# Patient Record
Sex: Male | Born: 1990
Health system: Southern US, Community
[De-identification: ages and names within clinical notes are randomized; demographics above are authoritative.]

## PROBLEM LIST (undated history)

## (undated) DIAGNOSIS — K219 Gastro-esophageal reflux disease without esophagitis: Secondary | ICD-10-CM

## (undated) DIAGNOSIS — R519 Headache, unspecified: Secondary | ICD-10-CM

## (undated) DIAGNOSIS — R51 Headache: Secondary | ICD-10-CM

## (undated) DIAGNOSIS — J45909 Unspecified asthma, uncomplicated: Secondary | ICD-10-CM

## (undated) HISTORY — PX: APPENDECTOMY: SHX54

---

## 2017-12-15 ENCOUNTER — Other Ambulatory Visit: Payer: Self-pay

## 2017-12-15 ENCOUNTER — Encounter: Payer: Self-pay | Admitting: *Deleted

## 2017-12-19 NOTE — Discharge Instructions (Signed)
[facility] °[facility] °ENDOSCOPIC SINUS SURGERY °[facility] ° °What is Functional Endoscopic Sinus Surgery? ° The Surgery involves making the natural openings of the sinuses larger by removing the bony partitions that separate the sinuses from the nasal cavity.  The natural sinus lining is preserved as much as possible to allow the sinuses to resume normal function after the surgery.  In some patients nasal polyps (excessively swollen lining of the sinuses) may be removed to relieve obstruction of the sinus openings.  The surgery is performed through the nose using lighted scopes, which eliminates the need for incisions on the face.  A septoplasty is a different procedure which is sometimes performed with sinus surgery.  It involves straightening the boy partition that separates the two sides of your nose.  A crooked or deviated septum may need repair if is obstructing the sinuses or nasal airflow.  Turbinate reduction is also often performed during sinus surgery.  The turbinates are bony proturberances from the side walls of the nose which swell and can obstruct the nose in patients with sinus and allergy problems.  Their size can be surgically reduced to help relieve nasal obstruction. ° °What Can Sinus Surgery Do For Me? ° Sinus surgery can reduce the frequency of sinus infections requiring antibiotic treatment.  This can provide improvement in nasal congestion, post-nasal drainage, facial pressure and nasal obstruction.  Surgery will NOT prevent you from ever having an infection again, so it usually only for patients who get infections 4 or more times yearly requiring antibiotics, or for infections that do not clear with antibiotics.  It will not cure nasal allergies, so patients with allergies may still require medication to treat their allergies after surgery. Surgery may improve headaches related to sinusitis, however, some people will continue to  require medication to control sinus headaches related to allergies.  Surgery will do nothing for other forms of headache (migraine, tension or cluster). ° °What Are the Risks of Endoscopic Sinus Surgery? ° Current techniques allow surgery to be performed safely with little risk, however, there are rare complications that patients should be aware of.  Because the sinuses are located around the eyes, there is risk of eye injury, including blindness, though again, this would be quite rare. This is usually a result of bleeding behind the eye during surgery, which puts the vision oat risk, though there are treatments to protect the vision and prevent permanent disrupted by surgery causing a leak of the spinal fluid that surrounds the brain.  More serious complications would include bleeding inside the brain cavity or damage to the brain.  Again, all of these complications are uncommon, and spinal fluid leaks can be safely managed surgically if they occur.  The most common complication of sinus surgery is bleeding from the nose, which may require packing or cauterization of the nose.  Continued sinus have polyps may experience recurrence of the polyps requiring revision surgery.  Alterations of sense of smell or injury to the tear ducts are also rare complications.  ° °What is the Surgery Like, and what is the Recovery? ° The Surgery usually takes a couple of hours to perform, and is usually performed under a general anesthetic (completely asleep).  Patients are usually discharged home after a couple of hours.  Sometimes during surgery it is necessary to pack the nose to control bleeding, and the packing is left in place for 24 - 48 hours, and removed by your surgeon.    If a septoplasty was performed during the procedure, there is often a splint placed which must be removed after 5-7 days.   °Discomfort: Pain is usually mild to moderate, and can be controlled by prescription pain medication or acetaminophen (Tylenol).   Aspirin, Ibuprofen (Advil, Motrin), or Naprosyn (Aleve) should be avoided, as they can cause increased bleeding.  Most patients feel sinus pressure like they have a bad head cold for several days.  Sleeping with your head elevated can help reduce swelling and facial pressure, as can ice packs over the face.  A humidifier may be helpful to keep the mucous and blood from drying in the nose.  ° °Diet: There are no specific diet restrictions, however, you should generally start with clear liquids and a light diet of bland foods because the anesthetic can cause some nausea.  Advance your diet depending on how your stomach feels.  Taking your pain medication with food will often help reduce stomach upset which pain medications can cause. ° °Nasal Saline Irrigation: It is important to remove blood clots and dried mucous from the nose as it is healing.  This is done by having you irrigate the nose at least 3 - 4 times daily with a salt water solution.  We recommend using NeilMed Sinus Rinse (available at the drug store).  Fill the squeeze bottle with the solution, bend over a sink, and insert the tip of the squeeze bottle into the nose ½ of an inch.  Point the tip of the squeeze bottle towards the inside corner of the eye on the same side your irrigating.  Squeeze the bottle and gently irrigate the nose.  If you bend forward as you do this, most of the fluid will flow back out of the nose, instead of down your throat.   The solution should be warm, near body temperature, when you irrigate.   Each time you irrigate, you should use a full squeeze bottle.  ° °Note that if you are instructed to use Nasal Steroid Sprays at any time after your surgery, irrigate with saline BEFORE using the steroid spray, so you do not wash it all out of the nose. °Another product, Nasal Saline Gel (such as AYR Nasal Saline Gel) can be applied in each nostril 3 - 4 times daily to moisture the nose and reduce scabbing or crusting. ° °Bleeding:   Bloody drainage from the nose can be expected for several days, and patients are instructed to irrigate their nose frequently with salt water to help remove mucous and blood clots.  The drainage may be dark red or brown, though some fresh blood may be seen intermittently, especially after irrigation.  Do not blow you nose, as bleeding may occur. If you must sneeze, keep your mouth open to allow air to escape through your mouth. ° °If heavy bleeding occurs: Irrigate the nose with saline to rinse out clots, then spray the nose 3 - 4 times with Afrin Nasal Decongestant Spray.  The spray will constrict the blood vessels to slow bleeding.  Pinch the lower half of your nose shut to apply pressure, and lay down with your head elevated.  Ice packs over the nose may help as well. If bleeding persists despite these measures, you should notify your doctor.  Do not use the Afrin routinely to control nasal congestion after surgery, as it can result in worsening congestion and may affect healing.  ° ° ° °Activity: Return to work varies among patients. Most patients will be   out of work at least 5 - 7 days to recover.  Patient may return to work after they are off of narcotic pain medication, and feeling well enough to perform the functions of their job.  Patients must avoid heavy lifting (over 10 pounds) or strenuous physical for 2 weeks after surgery, so your employer may need to assign you to light duty, or keep you out of work longer if light duty is not possible.  NOTE: you should not drive, operate dangerous machinery, do any mentally demanding tasks or make any important legal or financial decisions while on narcotic pain medication and recovering from the general anesthetic.  °  °Call Your Doctor Immediately if You Have Any of the Following: °1. Bleeding that you cannot control with the above measures °2. Loss of vision, double vision, bulging of the eye or black eyes. °3. Fever over 101 degrees °4. Neck stiffness with  severe headache, fever, nausea and change in mental state. °You are always encourage to call anytime with concerns, however, please call with requests for pain medication refills during office hours. ° °Office Endoscopy: During follow-up visits your doctor will remove any packing or splints that may have been placed and evaluate and clean your sinuses endoscopically.  Topical anesthetic will be used to make this as comfortable as possible, though you may want to take your pain medication prior to the visit.  How often this will need to be done varies from patient to patient.  After complete recovery from the surgery, you may need follow-up endoscopy from time to time, particularly if there is concern of recurrent infection or nasal polyps. ° ° °General Anesthesia, Adult, Care After °These instructions provide you with information about caring for yourself after your procedure. Your health care provider may also give you more specific instructions. Your treatment has been planned according to current medical practices, but problems sometimes occur. Call your health care provider if you have any problems or questions after your procedure. °What can I expect after the procedure? °After the procedure, it is common to have: °· Vomiting. °· A sore throat. °· Mental slowness. ° °It is common to feel: °· Nauseous. °· Cold or shivery. °· Sleepy. °· Tired. °· Sore or achy, even in parts of your body where you did not have surgery. ° °Follow these instructions at home: °For at least 24 hours after the procedure: °· Do not: °? Participate in activities where you could fall or become injured. °? Drive. °? Use heavy machinery. °? Drink alcohol. °? Take sleeping pills or medicines that cause drowsiness. °? Make important decisions or sign legal documents. °? Take care of children on your own. °· Rest. °Eating and drinking °· If you vomit, drink water, juice, or soup when you can drink without vomiting. °· Drink enough fluid to  keep your urine clear or pale yellow. °· Make sure you have little or no nausea before eating solid foods. °· Follow the diet recommended by your health care provider. °General instructions °· Have a responsible adult stay with you until you are awake and alert. °· Return to your normal activities as told by your health care provider. Ask your health care provider what activities are safe for you. °· Take over-the-counter and prescription medicines only as told by your health care provider. °· If you smoke, do not smoke without supervision. °· Keep all follow-up visits as told by your health care provider. This is important. °Contact a health care provider if: °· You   continue to have nausea or vomiting at home, and medicines are not helpful. °· You cannot drink fluids or start eating again. °· You cannot urinate after 8-12 hours. °· You develop a skin rash. °· You have fever. °· You have increasing redness at the site of your procedure. °Get help right away if: °· You have difficulty breathing. °· You have chest pain. °· You have unexpected bleeding. °· You feel that you are having a life-threatening or urgent problem. °This information is not intended to replace advice given to you by your health care provider. Make sure you discuss any questions you have with your health care provider. °Document Released: 06/14/2000 Document Revised: 08/11/2015 Document Reviewed: 02/20/2015 °Elsevier Interactive Patient Education © 2018 [employer] ° °

## 2017-12-22 ENCOUNTER — Ambulatory Visit
Admission: RE | Admit: 2017-12-22 | Discharge: 2017-12-22 | Disposition: A | Discharge status: Home / Self Care | Payer: Managed Care, Other (non HMO) | Source: Ambulatory Visit | Attending: Otolaryngology | Admitting: Otolaryngology

## 2017-12-22 ENCOUNTER — Ambulatory Visit: Payer: Managed Care, Other (non HMO) | Admitting: Anesthesiology

## 2017-12-22 ENCOUNTER — Encounter: Payer: Self-pay | Admitting: Otolaryngology

## 2017-12-22 ENCOUNTER — Encounter
Admission: RE | Disposition: A | Discharge status: Home / Self Care | Payer: Self-pay | Source: Ambulatory Visit | Attending: Otolaryngology

## 2017-12-22 DIAGNOSIS — J343 Hypertrophy of nasal turbinates: Secondary | ICD-10-CM | POA: Diagnosis not present

## 2017-12-22 DIAGNOSIS — J342 Deviated nasal septum: Secondary | ICD-10-CM | POA: Insufficient documentation

## 2017-12-22 DIAGNOSIS — Z87891 Personal history of nicotine dependence: Secondary | ICD-10-CM | POA: Diagnosis not present

## 2017-12-22 DIAGNOSIS — J45909 Unspecified asthma, uncomplicated: Secondary | ICD-10-CM | POA: Diagnosis not present

## 2017-12-22 HISTORY — DX: Gastro-esophageal reflux disease without esophagitis: K21.9

## 2017-12-22 HISTORY — DX: Unspecified asthma, uncomplicated: J45.909

## 2017-12-22 HISTORY — PX: SEPTOPLASTY: SHX2393

## 2017-12-22 HISTORY — DX: Headache: R51

## 2017-12-22 HISTORY — DX: Headache, unspecified: R51.9

## 2017-12-22 HISTORY — PX: TURBINATE REDUCTION: SHX6157

## 2017-12-22 SURGERY — SEPTOPLASTY, NOSE
Anesthesia: General | Site: Nose

## 2017-12-22 MED ORDER — LACTATED RINGERS IV SOLN
INTRAVENOUS | Status: DC
  Administered 2017-12-22 (×2): via INTRAVENOUS

## 2017-12-22 MED ORDER — CEFAZOLIN SODIUM-DEXTROSE 2-4 GM/100ML-% IV SOLN
2.0000 g | Freq: Once | INTRAVENOUS | Status: AC
  Administered 2017-12-22: 2 g via INTRAVENOUS

## 2017-12-22 MED ORDER — OXYMETAZOLINE HCL 0.05 % NA SOLN
2.0000 | Freq: Once | NASAL | Status: AC
  Administered 2017-12-22: 2 via NASAL

## 2017-12-22 MED ORDER — ACETAMINOPHEN 10 MG/ML IV SOLN
1000.0000 mg | Freq: Once | INTRAVENOUS | Status: AC
  Administered 2017-12-22: 1000 mg via INTRAVENOUS

## 2017-12-22 MED ORDER — LIDOCAINE HCL (CARDIAC) PF 100 MG/5ML IV SOSY
PREFILLED_SYRINGE | INTRAVENOUS | Status: DC | PRN
  Administered 2017-12-22: 40 mg via INTRAVENOUS

## 2017-12-22 MED ORDER — SUCCINYLCHOLINE CHLORIDE 20 MG/ML IJ SOLN
INTRAMUSCULAR | Status: DC | PRN
  Administered 2017-12-22: 80 mg via INTRAVENOUS

## 2017-12-22 MED ORDER — OXYCODONE HCL 5 MG/5ML PO SOLN
5.0000 mg | Freq: Once | ORAL | Status: AC | PRN
  Administered 2017-12-22: 5 mg via ORAL

## 2017-12-22 MED ORDER — GLYCOPYRROLATE 0.2 MG/ML IJ SOLN
INTRAMUSCULAR | Status: DC | PRN
  Administered 2017-12-22: 0.1 mg via INTRAVENOUS

## 2017-12-22 MED ORDER — MIDAZOLAM HCL 5 MG/5ML IJ SOLN
INTRAMUSCULAR | Status: DC | PRN
  Administered 2017-12-22: 2 mg via INTRAVENOUS

## 2017-12-22 MED ORDER — ONDANSETRON HCL 4 MG/2ML IJ SOLN
INTRAMUSCULAR | Status: DC | PRN
  Administered 2017-12-22: 4 mg via INTRAVENOUS

## 2017-12-22 MED ORDER — LIDOCAINE-EPINEPHRINE 1 %-1:100000 IJ SOLN
INTRAMUSCULAR | Status: DC | PRN
  Administered 2017-12-22: 4 mL

## 2017-12-22 MED ORDER — DEXAMETHASONE SODIUM PHOSPHATE 4 MG/ML IJ SOLN
INTRAMUSCULAR | Status: DC | PRN
  Administered 2017-12-22: 10 mg via INTRAVENOUS

## 2017-12-22 MED ORDER — FENTANYL CITRATE (PF) 100 MCG/2ML IJ SOLN: 25.0000 ug | INTRAMUSCULAR | Status: DC | PRN

## 2017-12-22 MED ORDER — ONDANSETRON HCL 4 MG/2ML IJ SOLN: 4.0000 mg | Freq: Once | INTRAMUSCULAR | Status: DC | PRN

## 2017-12-22 MED ORDER — OXYCODONE HCL 5 MG PO TABS: 5.0000 mg | ORAL_TABLET | Freq: Once | ORAL | Status: AC | PRN

## 2017-12-22 MED ORDER — LIDOCAINE HCL 4 % MT SOLN
OROMUCOSAL | Status: DC | PRN
  Administered 2017-12-22: 4 mL via TOPICAL

## 2017-12-22 MED ORDER — FENTANYL CITRATE (PF) 100 MCG/2ML IJ SOLN
INTRAMUSCULAR | Status: DC | PRN
  Administered 2017-12-22: 25 ug via INTRAVENOUS
  Administered 2017-12-22: 100 ug via INTRAVENOUS
  Administered 2017-12-22: 25 ug via INTRAVENOUS

## 2017-12-22 MED ORDER — PHENYLEPHRINE HCL 0.5 % NA SOLN
NASAL | Status: DC | PRN
  Administered 2017-12-22: 12:00:00 via TOPICAL

## 2017-12-22 MED ORDER — PROPOFOL 10 MG/ML IV BOLUS
INTRAVENOUS | Status: DC | PRN
  Administered 2017-12-22: 150 mg via INTRAVENOUS

## 2017-12-22 SURGICAL SUPPLY — 28 items
CANISTER SUCT 1200ML W/VALVE (MISCELLANEOUS) ×4 IMPLANT
COAGULATOR SUCT 8FR VV (MISCELLANEOUS) ×4 IMPLANT
DRAPE HEAD BAR (DRAPES) ×4 IMPLANT
ELECT REM PT RETURN 9FT ADLT (ELECTROSURGICAL) ×4
ELECTRODE REM PT RTRN 9FT ADLT (ELECTROSURGICAL) ×2 IMPLANT
GLOVE PI ULTRA LF STRL 7.5 (GLOVE) ×4 IMPLANT
GLOVE PI ULTRA NON LATEX 7.5 (GLOVE) ×4
KIT TURNOVER KIT A (KITS) ×4 IMPLANT
NDL ANESTHESIA 27G X 3.5 (NEEDLE) ×2 IMPLANT
NDL HYPO 27GX1-1/4 (NEEDLE) ×2 IMPLANT
NEEDLE ANESTHESIA  27G X 3.5 (NEEDLE) ×2
NEEDLE ANESTHESIA 27G X 3.5 (NEEDLE) ×2 IMPLANT
NEEDLE HYPO 27GX1-1/4 (NEEDLE) ×4 IMPLANT
NS IRRIG 500ML POUR BTL (IV SOLUTION) ×4 IMPLANT
PACK ENT CUSTOM (PACKS) ×4 IMPLANT
PATTIES SURGICAL .5 X3 (DISPOSABLE) ×4 IMPLANT
SOL ANTI-FOG 6CC FOG-OUT (MISCELLANEOUS) ×2 IMPLANT
SOL FOG-OUT ANTI-FOG 6CC (MISCELLANEOUS) ×2
SPLINT NASAL SEPTAL BLV .50 ST (MISCELLANEOUS) ×4 IMPLANT
STRAP BODY AND KNEE 60X3 (MISCELLANEOUS) ×4 IMPLANT
SUT CHROMIC 3-0 (SUTURE) ×2
SUT CHROMIC 3-0 KS 27XMFL CR (SUTURE) ×2
SUT ETHILON 3-0 KS 30 BLK (SUTURE) ×4 IMPLANT
SUT PLAIN GUT 4-0 (SUTURE) ×4 IMPLANT
SUTURE CHRMC 3-0 KS 27XMFL CR (SUTURE) IMPLANT
SYR 3ML LL SCALE MARK (SYRINGE) ×4 IMPLANT
TOWEL OR 17X26 4PK STRL BLUE (TOWEL DISPOSABLE) ×4 IMPLANT
WATER STERILE IRR 250ML POUR (IV SOLUTION) ×4 IMPLANT

## 2017-12-22 NOTE — Transfer of Care (Signed)
Immediate Anesthesia Transfer of Care Note  Patient: Jared English  Procedure(s) Performed: SEPTOPLASTY (N/A Nose) BILATERAL INFERIOR TURBINATE REDUCTION (Bilateral Nose)  Patient Location: PACU  Anesthesia Type: General ETT  Level of Consciousness: awake, alert  and patient cooperative  Airway and Oxygen Therapy: Patient Spontanous Breathing and Patient connected to supplemental oxygen  Post-op Assessment: Post-op Vital signs reviewed, Patient's Cardiovascular Status Stable, Respiratory Function Stable, Patent Airway and No signs of Nausea or vomiting  Post-op Vital Signs: Reviewed and stable  Complications: No apparent anesthesia complications

## 2017-12-22 NOTE — Anesthesia Procedure Notes (Signed)
Procedure Name: Intubation Date/Time: 12/22/2017 12:16 PM Performed by: Jimmy Picket, CRNA Pre-anesthesia Checklist: Patient identified, Emergency Drugs available, Suction available, Patient being monitored and Timeout performed Patient Re-evaluated:Patient Re-evaluated prior to induction Oxygen Delivery Method: Circle system utilized Preoxygenation: Pre-oxygenation with 100% oxygen Induction Type: IV induction Ventilation: Mask ventilation without difficulty Laryngoscope Size: Miller and 3 Grade View: Grade I Tube type: Oral Rae Tube size: 7.5 mm Number of attempts: 1 Placement Confirmation: ETT inserted through vocal cords under direct vision,  positive ETCO2 and breath sounds checked- equal and bilateral Tube secured with: Tape Dental Injury: Teeth and Oropharynx as per pre-operative assessment

## 2017-12-22 NOTE — H&P (Signed)
H&P has been reviewedand patient reevaluated,  and no changes necessary. To be downloaded later.  

## 2017-12-22 NOTE — Anesthesia Preprocedure Evaluation (Addendum)
Anesthesia Evaluation  Patient identified by MRN, date of birth, ID band Patient awake    Reviewed: Allergy & Precautions, H&P , NPO status , Patient's Chart, lab work & pertinent test results, reviewed documented beta blocker date and time   Airway Mallampati: II  TM Distance: >3 FB Neck ROM: full    Dental no notable dental hx.    Pulmonary asthma , former smoker,    Pulmonary exam normal breath sounds clear to auscultation       Cardiovascular negative cardio ROS Normal cardiovascular exam     Neuro/Psych    GI/Hepatic Neg liver ROS, Medicated,  Endo/Other  negative endocrine ROS  Renal/GU negative Renal ROS  negative genitourinary   Musculoskeletal   Abdominal   Peds  Hematology negative hematology ROS (+)   Anesthesia Other Findings   Reproductive/Obstetrics negative OB ROS                            Anesthesia Physical Anesthesia Plan  ASA: II  Anesthesia Plan: General ETT   Post-op Pain Management:    Induction:   PONV Risk Score and Plan:   Airway Management Planned:   Additional Equipment:   Intra-op Plan:   Post-operative Plan:   Informed Consent: I have reviewed the patients History and Physical, chart, labs and discussed the procedure including the risks, benefits and alternatives for the proposed anesthesia with the patient or authorized representative who has indicated his/her understanding and acceptance.     Plan Discussed with:   Anesthesia Plan Comments:         Anesthesia Quick Evaluation

## 2017-12-22 NOTE — Anesthesia Postprocedure Evaluation (Signed)
Anesthesia Post Note  Patient: Jared English  Procedure(s) Performed: SEPTOPLASTY (N/A Nose) BILATERAL INFERIOR TURBINATE REDUCTION (Bilateral Nose)  Patient location during evaluation: PACU Anesthesia Type: General Level of consciousness: awake and alert Pain management: pain level controlled Vital Signs Assessment: post-procedure vital signs reviewed and stable Respiratory status: spontaneous breathing Cardiovascular status: blood pressure returned to baseline Anesthetic complications: no    Verner Chol, III,  Jenesys Casseus D

## 2017-12-22 NOTE — Op Note (Signed)
12/22/2017  1:30 PM [id number]   Pre-Op Dx:  Deviated Nasal Septum, Hypertrophic Inferior Turbinates  Post-op Dx: Same  Proc: Nasal Septoplasty, Bilateral Partial Reduction Inferior Turbinates   Surg:  Beverly Sessions Elmar Antigua  Anes:  GOT  EBL: 50 mL  Comp: None  Findings: Extremely deviated septum with the caudal tip extending into the left nostril opening.  Ethmoid plate buckled to the left side and pulling the quadrangular cartilage to the left.  Inferior overhanging cartilage and septal spur on the right side. Enlarged turbinates.   Procedure: With the patient in a comfortable supine position,  general orotracheal anesthesia was induced without difficulty.     The patient received preoperative Afrin spray for topical decongestion and vasoconstriction.  Intravenous prophylactic antibiotics were administered.  At an appropriate level, the patient was placed in a semi-sitting position.  Nasal vibrissae were trimmed.   1% Xylocaine with 1:100,000 epinephrine, 5 cc's, was infiltrated into the anterior floor of the nose, into the nasal spine region, into the membranous columella, and finally into the submucoperichondrial plane of the septum on both sides.  Several minutes were allowed for this to take effect.  Cottoniod pledgetts soaked in Afrin and 4% Xylocaine were placed into both nasal cavities and left while the patient was prepped and draped in the standard fashion.  The materials were removed from the nose and observed to be intact and correct in number.  The nose was inspected with a headlight and the 0 degrees scope with the findings as described above.  A left hemitransfixion incision was sharply executed and carried down to the quadrangular cartilage. The mucoperichondrium was elelvated along both sides of the quadrangular plate back to the bony-cartilaginous junction. The mucoperiostium was then elevated along the ethmoid plate and the vomer. The boney-catilaginous junction was then  split with a freer elevator and the mucoperiosteum was elevated on the opposite side. The mucoperiosteum was then elevated along the maxillary crest as needed to expose the crooked bone of the crest.  Boney spurs of the vomer and maxillary crest were removed with Lenoria Chime forceps.  The cartilaginous plate was trimmed along its posterior and inferior borders of about 2 mm of cartilage to free it up inferiorly. Some of the deviated ethmoid plate was then fractured and removed with Takahashi forceps to free up the posterior border of the quadrangular plate and allow it to swing back to the midline. The mucosal flaps were placed back into their anatomic position to allow visualization of the airways. The septum now sat in the midline with an improved airway.  A 3-0 Chromic suture on a Keith needle in used to anchor the inferior septum at the nasal spine with a through and through suture. The mucosal flaps are then sutured together using a through and through whip stitch of 4-0 Plain Gut with a mini-Keith needle.  This was used to close the hemitransfixion incision as well.   The inferior turbinates were then inspected. An incision was created along the inferior aspect of the left inferior turbinate with removal of some of the inferior soft tissue and bone. Electrocautery was used to control bleeding in the area. The remaining turbinate was then outfractured to open up the airway further. There was no significant bleeding noted. The right turbinate was then trimmed and outfractured in a similar fashion.  The airways were then visualized and showed open passageways on both sides that were significantly improved compared to before surgery. There was no signifcant bleeding. Nasal splints were  applied to both sides of the septum using Xomed 0.69mm regular sized splints that were trimmed, and then held in position with a 3-0 Nylon through and through suture.  The patient was turned back over to anesthesia, and  awakened, extubated, and taken to the PACU in satisfactory condition.  Dispo:   PACU to home  Plan: Ice, elevation, narcotic analgesia, steroid taper, and prophylactic antibiotics for the duration of indwelling nasal foreign bodies.  We will reevaluate the patient in the office in 6 days and remove the septal splints.  Return to work in 10 days, strenuous activities in two weeks.   Beverly Sessions Analiz Tvedt 12/22/2017 1:30 PM

## 2017-12-23 ENCOUNTER — Encounter: Payer: Self-pay | Admitting: Otolaryngology

## 2018-05-04 ENCOUNTER — Other Ambulatory Visit: Payer: Self-pay

## 2018-05-04 ENCOUNTER — Ambulatory Visit
Admission: EM | Admit: 2018-05-04 | Discharge: 2018-05-04 | Disposition: A | Discharge status: Home / Self Care | Payer: 59 | Attending: Family Medicine | Admitting: Family Medicine

## 2018-05-04 ENCOUNTER — Encounter: Payer: Self-pay | Admitting: Emergency Medicine

## 2018-05-04 DIAGNOSIS — J45990 Exercise induced bronchospasm: Secondary | ICD-10-CM | POA: Diagnosis not present

## 2018-05-04 MED ORDER — ALBUTEROL SULFATE HFA 108 (90 BASE) MCG/ACT IN AERS: 1.0000 | INHALATION_SPRAY | Freq: Four times a day (QID) | RESPIRATORY_TRACT | 0 refills | Status: DC | PRN

## 2018-05-04 NOTE — ED Provider Notes (Signed)
[facility]    CSN: [id number] Arrival date & time: 05/04/18  1835     History   Chief Complaint Chief Complaint  Patient presents with  . Asthma    HPI Jared English is a 28 y.o. male.   HPI  -year-old male presents with shortness of breath that started today while [employer] at the [employer].  That he had asthma as a child but his last attack was somewhere around 28 years of age.  He describes having inhalers and nebulizers at that point time.  He states that since then he has not had any kinds of problems with his asthma.  He is played baseball no experience asthma during his workouts etc.  The day it was warm outside and he was exercising very vigorously at the [employer] when he started having the bronchospasm.  He was having wheezing and he states that the attack lasted less than 5 minutes total time.  Dates he is here today because he is not allowed to participate in exercises at the [employer] without having an inhaler first.         Past Medical History:  Diagnosis Date  . Asthma    childhood  . GERD (gastroesophageal reflux disease)   . Headache    sinus    There are no active problems to display for this patient.   Past Surgical History:  Procedure Laterality Date  . APPENDECTOMY    . SEPTOPLASTY N/A 12/22/2017   Procedure: SEPTOPLASTY;  Surgeon: Vernie Murders, MD;  Location: [facility];  Service: ENT;  Laterality: N/A;  . TURBINATE REDUCTION Bilateral 12/22/2017   Procedure: BILATERAL INFERIOR TURBINATE REDUCTION;  Surgeon: Vernie Murders, MD;  Location: [facility];  Service: ENT;  Laterality: Bilateral;       Home Medications    Prior to Admission medications   Medication Sig Start Date End Date Taking? Authorizing Provider  Cholecalciferol (VITAMIN D PO) Take by mouth daily.   Yes [provider]  esomeprazole (NEXIUM) 20 MG capsule Take 20 mg by mouth daily at 12 noon.   Yes [provider]  albuterol  (PROVENTIL HFA;VENTOLIN HFA) 108 (90 Base) MCG/ACT inhaler Inhale 1-2 puffs into the lungs every 6 (six) hours as needed for wheezing or shortness of breath. Use with spacer 05/04/18   Lutricia Feil, PA-C    Family History History reviewed. No pertinent family history.  Social History Social History   Tobacco Use  . Smoking status: Former Smoker    Packs/day: 1.00    Years: 10.00    Pack years: 10.00    Types: Cigarettes    Last attempt to quit: 03/2017    Years since quitting: 1.1  . Smokeless tobacco: Never Used  Substance Use Topics  . Alcohol use: Yes    Alcohol/week: 3.0 standard drinks    Types: 3 Cans of beer per week  . Drug use: Not on file     Allergies   Patient has no known allergies.   Review of Systems Review of Systems  Constitutional: Positive for activity change. Negative for appetite change, chills, fatigue and fever.  Respiratory: Positive for cough and shortness of breath.   All other systems reviewed and are negative.    Physical Exam Triage Vital Signs ED Triage Vitals  Enc Vitals Group     BP 05/04/18 1852 133/77     Pulse Rate 05/04/18 1852 66     Resp 05/04/18 1852 18  Temp 05/04/18 1852 (!) 97.4 F (36.3 C)     Temp Source 05/04/18 1852 Oral     SpO2 05/04/18 1852 100 %     Weight 05/04/18 1853 154 lb (69.9 kg)     Height 05/04/18 1853 5\' 5"  (1.651 m)     Head Circumference --      Peak Flow --      Pain Score 05/04/18 1852 0     Pain Loc --      Pain Edu? --      Excl. in GC? --    No data found.  Updated Vital Signs BP 133/77 (BP Location: Right Arm)   Pulse 66   Temp (!) 97.4 F (36.3 C) (Oral)   Resp 18   Ht 5\' 5"  (1.651 m)   Wt 154 lb (69.9 kg)   SpO2 100%   BMI 25.63 kg/m   Visual Acuity Right Eye Distance:   Left Eye Distance:   Bilateral Distance:    Right Eye Near:   Left Eye Near:    Bilateral Near:     Physical Exam Vitals signs and nursing note reviewed.  Constitutional:      General: He is  not in acute distress.    Appearance: Normal appearance. He is normal weight. He is not ill-appearing, toxic-appearing or diaphoretic.  HENT:     Head: Normocephalic and atraumatic.     Nose: Nose normal.     Mouth/Throat:     Mouth: Mucous membranes are moist.     Pharynx: Oropharynx is clear.  Eyes:     General:        Right eye: No discharge.        Left eye: No discharge.     Conjunctiva/sclera: Conjunctivae normal.  Neck:     Musculoskeletal: Normal range of motion and neck supple.  Cardiovascular:     Rate and Rhythm: Normal rate and regular rhythm.  Pulmonary:     Effort: Pulmonary effort is normal.     Breath sounds: Normal breath sounds.  Musculoskeletal: Normal range of motion.  Skin:    General: Skin is warm and dry.  Neurological:     General: No focal deficit present.     Mental Status: He is alert and oriented to person, place, and time.  Psychiatric:        Mood and Affect: Mood normal.        Behavior: Behavior normal.        Thought Content: Thought content normal.        Judgment: Judgment normal.      UC Treatments / Results  Labs (all labs ordered are listed, but only abnormal results are displayed) Labs Reviewed - No data to display  EKG None  Radiology No results found.  Procedures Procedures (including critical care time)  Medications Ordered in UC Medications - No data to display  Initial Impression / Assessment and Plan / UC Course  I have reviewed the triage vital signs and the nursing notes.  Pertinent labs & imaging results that were available during my care of the patient were reviewed by me and considered in my medical decision making (see chart for details).   She describes an exercise-induced asthma attack with training today at the [employer].  Was short lived;He recovered within about 5 minutes.  I will prescribe an albuterol inhaler for him that he may use prior to any exercises that he needs to participate in at the  [employer].  He will need to find a primary care physician for further evaluation and care.  [place] moved [place] from [place] and he will contact his insurance carrier for list of participating physicians.  Final Clinical Impressions(s) / UC Diagnoses   Final diagnoses:  Exercise-induced asthma with acute exacerbation   Discharge Instructions   None    ED Prescriptions    Medication Sig Dispense Auth. Provider   albuterol (PROVENTIL HFA;VENTOLIN HFA) 108 (90 Base) MCG/ACT inhaler Inhale 1-2 puffs into the lungs every 6 (six) hours as needed for wheezing or shortness of breath. Use with spacer 1 Inhaler Lutricia Feiloemer, Shakirah Kirkey P, PA-C     Controlled Substance Prescriptions [place] Controlled Substance Registry consulted? Not Applicable   Lutricia FeilRoemer, Jeryl Wilbourn P, PA-C 05/04/18 1916

## 2018-05-04 NOTE — ED Triage Notes (Signed)
Patient c/o shortness of breath that started today while running at the [employer] today. Patient states he needs an inhaler in order to continue PT at the [employer]. Patient states he does not currently have a primary care doctor.

## 2018-05-09 ENCOUNTER — Ambulatory Visit
Admission: EM | Admit: 2018-05-09 | Discharge: 2018-05-09 | Disposition: A | Discharge status: Home / Self Care | Payer: 59 | Attending: Family Medicine | Admitting: Family Medicine

## 2018-05-09 ENCOUNTER — Other Ambulatory Visit: Payer: Self-pay

## 2018-05-09 ENCOUNTER — Ambulatory Visit (INDEPENDENT_AMBULATORY_CARE_PROVIDER_SITE_OTHER): Payer: 59

## 2018-05-09 ENCOUNTER — Encounter: Payer: Self-pay | Admitting: Emergency Medicine

## 2018-05-09 DIAGNOSIS — M722 Plantar fascial fibromatosis: Secondary | ICD-10-CM | POA: Diagnosis not present

## 2018-05-09 DIAGNOSIS — M79672 Pain in left foot: Secondary | ICD-10-CM | POA: Diagnosis not present

## 2018-05-09 MED ORDER — MELOXICAM 15 MG PO TABS: 15.0000 mg | ORAL_TABLET | Freq: Every day | ORAL | 0 refills | Status: DC

## 2018-05-09 NOTE — ED Provider Notes (Signed)
[facility]    CSN: [id number] Arrival date & time: 05/09/18  1814     History   Chief Complaint Chief Complaint  Patient presents with  . Foot Pain    left    HPI Jared English is a 28 y.o. male.   HPI  -year-old male presents with left a heel pain.  He states that the pain is located in the heel and when he steps on it radiates up and towards his ankle.  States this started about a week ago.  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  He states that when he first steps on his heel in the morning is when it is the worst.  Also when he jumps the heel hurts when he lands on it.  He is bothering him.  The [employer] will not let him back to participate in PT until it is evaluated.  Does notice limping when it is the most painful        Past Medical History:  Diagnosis Date  . Asthma    childhood  . GERD (gastroesophageal reflux disease)   . Headache    sinus    There are no active problems to display for this patient.   Past Surgical History:  Procedure Laterality Date  . APPENDECTOMY    . SEPTOPLASTY N/A 12/22/2017   Procedure: SEPTOPLASTY;  Surgeon: Vernie MurdersJuengel, Paul, MD;  [facility];  Service: ENT;  Laterality: N/A;  . TURBINATE REDUCTION Bilateral 12/22/2017   Procedure: BILATERAL INFERIOR TURBINATE REDUCTION;  Surgeon: Vernie MurdersJuengel, Paul, MD;  [facility];  Service: ENT;  Laterality: Bilateral;       Home Medications    Prior to Admission medications   Medication Sig Start Date End Date Taking? Authorizing Provider  albuterol (PROVENTIL HFA;VENTOLIN HFA) 108 (90 Base) MCG/ACT inhaler Inhale 1-2 puffs into the lungs every 6 (six) hours as needed for wheezing or shortness of breath. Use with spacer 05/04/18  Yes Lutricia Feiloemer, Venicia Vandall P, PA-C  Cholecalciferol (VITAMIN D PO) Take by mouth daily.   Yes [provider]  esomeprazole (NEXIUM) 20 MG capsule Take 20 mg by mouth daily at  12 noon.   Yes [provider]  meloxicam (MOBIC) 15 MG tablet Take 1 tablet (15 mg total) by mouth daily. Take with food 05/09/18   Lutricia Feiloemer, Karsynn Deweese P, PA-C    Family History History reviewed. No pertinent family history.  Social History Social History   Tobacco Use  . Smoking status: Former Smoker    Packs/day: 1.00    Years: 10.00    Pack years: 10.00    Types: Cigarettes    Last attempt to quit: 03/2017    Years since quitting: 1.1  . Smokeless tobacco: Never Used  Substance Use Topics  . Alcohol use: Yes    Alcohol/week: 3.0 standard drinks    Types: 3 Cans of beer per week  . Drug use: Not Currently     Allergies   Patient has no known allergies.   Review of Systems Review of Systems  Constitutional: Positive for activity change. Negative for chills, fatigue and fever.  Musculoskeletal: Positive for gait problem and myalgias.  All other systems reviewed and are negative.    Physical Exam Triage Vital Signs ED Triage Vitals  Enc Vitals Group     BP 05/09/18 1837 122/70     Pulse Rate 05/09/18 1837 66     Resp 05/09/18  1837 18     Temp 05/09/18 1837 98.1 F (36.7 C)     Temp Source 05/09/18 1837 Oral     SpO2 05/09/18 1837 100 %     Weight 05/09/18 1834 154 lb (69.9 kg)     Height 05/09/18 1834 5\' 5"  (1.651 m)     Head Circumference --      Peak Flow --      Pain Score 05/09/18 1834 4     Pain Loc --      Pain Edu? --      Excl. in GC? --    No data found.  Updated Vital Signs BP 122/70 (BP Location: Left Arm)   Pulse 66   Temp 98.1 F (36.7 C) (Oral)   Resp 18   Ht 5\' 5"  (1.651 m)   Wt 154 lb (69.9 kg)   SpO2 100%   BMI 25.63 kg/m   Visual Acuity Right Eye Distance:   Left Eye Distance:   Bilateral Distance:    Right Eye Near:   Left Eye Near:    Bilateral Near:     Physical Exam Vitals signs and nursing note reviewed.  Constitutional:      General: He is not in acute distress.    Appearance: He is normal weight. He is  not ill-appearing, toxic-appearing or diaphoretic.  HENT:     Head: Normocephalic.     Nose: Nose normal.     Mouth/Throat:     Mouth: Mucous membranes are moist.  Eyes:     General:        Right eye: No discharge.        Left eye: No discharge.     Conjunctiva/sclera: Conjunctivae normal.  Neck:     Musculoskeletal: Normal range of motion and neck supple.  Musculoskeletal: Normal range of motion.        General: Tenderness present.     Comments: Examination of the left foot has good range of motion of the ankle and subtalar joints.  Patient has no discomfort of the forefoot/ midfoot.  Has tenderness is maximal over the medial weightbearing tubercle of the heel but also with compression of the heel he still has his pain.  There is no tenderness of the heel he is.  Forced dorsiflexion of the toes do not cause him pain either.  Skin:    General: Skin is warm and dry.  Neurological:     General: No focal deficit present.     Mental Status: He is alert and oriented to person, place, and time.  Psychiatric:        Mood and Affect: Mood normal.        Behavior: Behavior normal.        Thought Content: Thought content normal.        Judgment: Judgment normal.      UC Treatments / Results  Labs (all labs ordered are listed, but only abnormal results are displayed) Labs Reviewed - No data to display  EKG None  Radiology Dg Foot Complete Left  Result Date: 05/09/2018 CLINICAL DATA:  28 year old male with heel pain for 1 week. No known injury. EXAM: LEFT FOOT - COMPLETE 3+ VIEW COMPARISON:  None. FINDINGS: Bone mineralization is within normal limits. There is no evidence of fracture or dislocation. There is no evidence of arthropathy or other focal bone abnormality. No discrete soft tissue abnormality. IMPRESSION: Negative. Electronically Signed   By: Odessa Fleming M.D.   On: 05/09/2018 19:35  Procedures Procedures (including critical care time)  Medications Ordered in UC Medications  - No data to display  Initial Impression / Assessment and Plan / UC Course  I have reviewed the triage vital signs and the nursing notes.  Pertinent labs & imaging results that were available during my care of the patient were reviewed by me and considered in my medical decision making (see chart for details).    ReviewedThe x-rays with the patient.  No fracture or dislocation.  He has a plantar fasciitis.  Commend using Mobic for anti-inflammatory take with food.  Instructed him in several stretching exercises.  He should place a Silastic heel lifts in his shoes.  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  If he needs further care for the plantar fasciitis he should follow-up with a podiatrist or orthopedic surgeon.   Final Clinical Impressions(s) / UC Diagnoses   Final diagnoses:  Plantar fasciitis of left foot     Discharge Instructions     Perform stretching exercises demonstrated to you 3 times daily holding for 3 minutes and no bouncing.  Place Silastic heel lifts in your boots.  Take your medicine with food.    ED Prescriptions    Medication Sig Dispense Auth. Provider   meloxicam (MOBIC) 15 MG tablet Take 1 tablet (15 mg total) by mouth daily. Take with food 30 tablet Lutricia Feil, PA-C     Controlled Substance Prescriptions [place] Controlled Substance Registry consulted? Not Applicable   Lutricia Feil, PA-C 05/09/18 2137

## 2018-05-09 NOTE — Discharge Instructions (Addendum)
Perform stretching exercises demonstrated to you 3 times daily holding for 3 minutes and no bouncing.  Place Silastic heel lifts in your boots.  Take your medicine with food.

## 2018-05-09 NOTE — ED Triage Notes (Signed)
Pt c/o left foot pain. Pain is locate in the heel and radiates up into his ankle when he walks. Started about a week ago. No known injury.

## 2018-07-04 DIAGNOSIS — Z1159 Encounter for screening for other viral diseases: Secondary | ICD-10-CM | POA: Diagnosis not present

## 2020-04-08 ENCOUNTER — Other Ambulatory Visit: Payer: Self-pay

## 2020-04-08 ENCOUNTER — Ambulatory Visit
Admission: EM | Admit: 2020-04-08 | Discharge: 2020-04-08 | Disposition: A | Discharge status: Home / Self Care | Payer: 59 | Attending: Family Medicine | Admitting: Family Medicine

## 2020-04-08 ENCOUNTER — Encounter: Payer: Self-pay | Admitting: Physician Assistant

## 2020-04-08 DIAGNOSIS — R0981 Nasal congestion: Secondary | ICD-10-CM | POA: Diagnosis not present

## 2020-04-08 DIAGNOSIS — B349 Viral infection, unspecified: Secondary | ICD-10-CM

## 2020-04-08 DIAGNOSIS — Z87891 Personal history of nicotine dependence: Secondary | ICD-10-CM | POA: Diagnosis not present

## 2020-04-08 DIAGNOSIS — Z79899 Other long term (current) drug therapy: Secondary | ICD-10-CM | POA: Insufficient documentation

## 2020-04-08 DIAGNOSIS — R519 Headache, unspecified: Secondary | ICD-10-CM | POA: Diagnosis present

## 2020-04-08 DIAGNOSIS — R059 Cough, unspecified: Secondary | ICD-10-CM

## 2020-04-08 DIAGNOSIS — J029 Acute pharyngitis, unspecified: Secondary | ICD-10-CM | POA: Diagnosis not present

## 2020-04-08 DIAGNOSIS — J45909 Unspecified asthma, uncomplicated: Secondary | ICD-10-CM

## 2020-04-08 DIAGNOSIS — U071 COVID-19: Secondary | ICD-10-CM | POA: Insufficient documentation

## 2020-04-08 DIAGNOSIS — K219 Gastro-esophageal reflux disease without esophagitis: Secondary | ICD-10-CM | POA: Insufficient documentation

## 2020-04-08 MED ORDER — PSEUDOEPH-BROMPHEN-DM 30-2-10 MG/5ML PO SYRP: 10.0000 mL | ORAL_SOLUTION | Freq: Four times a day (QID) | ORAL | 0 refills | Status: AC | PRN

## 2020-04-08 MED ORDER — IPRATROPIUM BROMIDE 0.06 % NA SOLN: 2.0000 | Freq: Four times a day (QID) | NASAL | 12 refills | Status: DC

## 2020-04-08 MED ORDER — IBUPROFEN 800 MG PO TABS: 800.0000 mg | ORAL_TABLET | Freq: Three times a day (TID) | ORAL | 0 refills | Status: DC

## 2020-04-08 MED ORDER — ALBUTEROL SULFATE HFA 108 (90 BASE) MCG/ACT IN AERS: 1.0000 | INHALATION_SPRAY | Freq: Four times a day (QID) | RESPIRATORY_TRACT | 0 refills | Status: DC | PRN

## 2020-04-08 NOTE — Discharge Instructions (Addendum)

## 2020-04-08 NOTE — ED Provider Notes (Signed)
[facility]    CSN: [id number] Arrival date & time: 04/08/20  1859      History   Chief Complaint Chief Complaint  Patient presents with  . Headache  . Sore Throat  . Cough    HPI Jared English is a 30 y.o. male presenting for 4 day history of headaches, fatigue, nasal congestion, sore throat and cough. Admits to more than 1 COVID exposure through work. He is a Emergency planning/management officerpolice officer. He is fully vaccinated for COVID.  Patient denies any fever.  He states he has been taking over-the-counter decongestants and Tylenol but continues to have headaches.  Patient states that he feels worse today than he did at onset of symptoms. PMH is significant for asthma and GERD. Patient says he is out of his albuterol inhaler and has been occasionally short of breath.  He has no other symptoms, complaints or concerns.  HPI  Past Medical History:  Diagnosis Date  . Asthma    childhood  . GERD (gastroesophageal reflux disease)   . Headache    sinus    There are no problems to display for this patient.   Past Surgical History:  Procedure Laterality Date  . APPENDECTOMY    . SEPTOPLASTY N/A 12/22/2017   Procedure: SEPTOPLASTY;  Surgeon: Vernie MurdersJuengel, Paul, MD;  [facility];  Service: ENT;  Laterality: N/A;  . TURBINATE REDUCTION Bilateral 12/22/2017   Procedure: BILATERAL INFERIOR TURBINATE REDUCTION;  Surgeon: Vernie MurdersJuengel, Paul, MD;  [facility];  Service: ENT;  Laterality: Bilateral;       Home Medications    Prior to Admission medications   Medication Sig Start Date End Date Taking? Authorizing Provider  brompheniramine-pseudoephedrine-DM 30-2-10 MG/5ML syrup Take 10 mLs by mouth 4 (four) times daily as needed for up to 7 days. 04/08/20 04/15/20 Yes Shirlee LatchEaves, Aiken Withem B, PA-C  ibuprofen (ADVIL) 800 MG tablet Take 1 tablet (800 mg total) by mouth 3 (three) times daily. 04/08/20  Yes Eusebio FriendlyEaves, Serinity Ware B, PA-C  ipratropium (ATROVENT) 0.06 % nasal spray Place 2 sprays  into both nostrils 4 (four) times daily. 04/08/20  Yes Shirlee LatchEaves, Lyndie Vanderloop B, PA-C  albuterol (VENTOLIN HFA) 108 (90 Base) MCG/ACT inhaler Inhale 1-2 puffs into the lungs every 6 (six) hours as needed for wheezing or shortness of breath. Use with spacer 04/08/20 04/08/21  Shirlee LatchEaves, Ade Stmarie B, PA-C  Cholecalciferol (VITAMIN D PO) Take by mouth daily.    [provider]  esomeprazole (NEXIUM) 20 MG capsule Take 20 mg by mouth daily at 12 noon.    [provider]    Family History History reviewed. No pertinent family history.  Social History Social History   Tobacco Use  . Smoking status: Former Smoker    Packs/day: 1.00    Years: 10.00    Pack years: 10.00    Types: Cigarettes    Quit date: 03/2017    Years since quitting: 3.0  . Smokeless tobacco: Never Used  Vaping Use  . Vaping Use: Former  Substance Use Topics  . Alcohol use: Yes    Alcohol/week: 3.0 standard drinks    Types: 3 Cans of beer per week  . Drug use: Not Currently     Allergies   Patient has no known allergies.   Review of Systems Review of Systems  Constitutional: Positive for fatigue. Negative for fever.  HENT: Positive for congestion, rhinorrhea and sore throat. Negative for sinus pressure and sinus pain.   Respiratory: Positive for cough. Negative for shortness  of breath.   Gastrointestinal: Negative for abdominal pain, diarrhea, nausea and vomiting.  Musculoskeletal: Negative for myalgias.  Neurological: Positive for headaches. Negative for weakness and light-headedness.  Hematological: Negative for adenopathy.     Physical Exam Triage Vital Signs ED Triage Vitals  Enc Vitals Group     BP 04/08/20 1908 (!) 132/92     Pulse Rate 04/08/20 1908 82     Resp 04/08/20 1908 18     Temp 04/08/20 1908 97.9 F (36.6 C)     Temp src --      SpO2 04/08/20 1908 98 %     Weight --      Height --      Head Circumference --      Peak Flow --      Pain Score 04/08/20 1906 7     Pain Loc --       Pain Edu? --      Excl. in GC? --    No data found.  Updated Vital Signs BP (!) 132/92   Pulse 82   Temp 97.9 F (36.6 C)   Resp 18   SpO2 98%      Physical Exam Vitals and nursing note reviewed.  Constitutional:      General: He is not in acute distress.    Appearance: Normal appearance. He is well-developed and well-nourished. He is not ill-appearing or diaphoretic.  HENT:     Head: Normocephalic and atraumatic.     Nose: Congestion and rhinorrhea present.     Mouth/Throat:     Mouth: Mucous membranes are normal. Mucous membranes are moist.     Pharynx: Oropharynx is clear. Uvula midline. Posterior oropharyngeal erythema present. No oropharyngeal exudate.     Tonsils: No tonsillar abscesses.  Eyes:     General: No scleral icterus.       Right eye: No discharge.        Left eye: No discharge.     Extraocular Movements: EOM normal.     Conjunctiva/sclera: Conjunctivae normal.  Neck:     Thyroid: No thyromegaly.     Trachea: No tracheal deviation.  Cardiovascular:     Rate and Rhythm: Normal rate and regular rhythm.     Heart sounds: Normal heart sounds.  Pulmonary:     Effort: Pulmonary effort is normal. No respiratory distress.     Breath sounds: Normal breath sounds. No wheezing, rhonchi or rales.  Musculoskeletal:     Cervical back: Normal range of motion and neck supple.  Lymphadenopathy:     Cervical: No cervical adenopathy.  Skin:    General: Skin is warm and dry.     Findings: No rash.  Neurological:     General: No focal deficit present.     Mental Status: He is alert. Mental status is at baseline.     Motor: No weakness.     Gait: Gait normal.  Psychiatric:        Mood and Affect: Mood normal.        Behavior: Behavior normal.        Thought Content: Thought content normal.      UC Treatments / Results  Labs (all labs ordered are listed, but only abnormal results are displayed) Labs Reviewed  SARS CORONAVIRUS 2 (TAT 6-24 HRS)     EKG   Radiology No results found.  Procedures Procedures (including critical care time)  Medications Ordered in UC Medications - No data to display  Initial Impression /  Assessment and Plan / UC Course  I have reviewed the triage vital signs and the nursing notes.  Pertinent labs & imaging results that were available during my care of the patient were reviewed by me and considered in my medical decision making (see chart for details).   Suspect viral illness, likely COVID-19 given multiple positive exposures.  Current CDC guidelines, isolation protocol and ED precautions reviewed with patient.  Advised supportive care with increasing rest and fluid intake.  I sent Bromfed, Atrovent nasal spray, ibuprofen 800 mg tablets and refilled his albuterol inhaler.  Advised him to follow-up with our clinic as needed.   Final Clinical Impressions(s) / UC Diagnoses   Final diagnoses:  Viral illness  Cough  Sore throat  Nasal congestion  Uncomplicated asthma, unspecified asthma severity, unspecified whether persistent     Discharge Instructions     You have received COVID testing today either for positive exposure, concerning symptoms that could be related to COVID infection, screening purposes, or re-testing after confirmed positive.  Your test obtained today checks for active viral infection in the last 1-2 weeks. If your test is negative now, you can still test positive later. So, if you do develop symptoms you should either get re-tested and/or isolate x 5 days and then strict mask use x 5 days (unvaccinated) or mask use x 10 days (vaccinated). Please follow CDC guidelines.  While Rapid antigen tests come back in 15-20 minutes, send out PCR/molecular test results typically come back within 1-3 days. In the mean time, if you are symptomatic, assume this could be a positive test and treat/monitor yourself as if you do have COVID.   We will call with test results if positive. Please  download the MyChart app and set up a profile to access test results.   If symptomatic, go home and rest. Push fluids. Take Tylenol as needed for discomfort. Gargle warm salt water. Throat lozenges. Take Mucinex DM or Robitussin for cough. Humidifier in bedroom to ease coughing. Warm showers. Also review the COVID handout for more information.  COVID-19 INFECTION: The incubation period of COVID-19 is approximately 14 days after exposure, with most symptoms developing in roughly 4-5 days. Symptoms may range in severity from mild to critically severe. Roughly 80% of those infected will have mild symptoms. People of any age may become infected with COVID-19 and have the ability to transmit the virus. The most common symptoms include: fever, fatigue, cough, body aches, headaches, sore throat, nasal congestion, shortness of breath, nausea, vomiting, diarrhea, changes in smell and/or taste.    COURSE OF ILLNESS Some patients may begin with mild disease which can progress quickly into critical symptoms. If your symptoms are worsening please call ahead to the Emergency Department and proceed there for further treatment. Recovery time appears to be roughly 1-2 weeks for mild symptoms and 3-6 weeks for severe disease.   GO IMMEDIATELY TO ER FOR FEVER YOU ARE UNABLE TO GET DOWN WITH TYLENOL, BREATHING PROBLEMS, CHEST PAIN, FATIGUE, LETHARGY, INABILITY TO EAT OR DRINK, ETC  QUARANTINE AND ISOLATION: To help decrease the spread of COVID-19 please remain isolated if you have COVID infection or are highly suspected to have COVID infection. This means -stay home and isolate to one room in the home if you live with others. Do not share a bed or bathroom with others while ill, sanitize and wipe down all countertops and keep common areas clean and disinfected. Stay home for 5 days. If you have no symptoms or your  symptoms are resolving after 5 days, you can leave your house. Continue to wear a mask around others for 5  additional days. If you have been in close contact (within 6 feet) of someone diagnosed with COVID 19, you are advised to quarantine in your home for 14 days as symptoms can develop anywhere from 2-14 days after exposure to the virus. If you develop symptoms, you  must isolate.  Most current guidelines for COVID after exposure -unvaccinated: isolate 5 days and strict mask use x 5 days. Test on day 5 is possible -vaccinated: wear mask x 10 days if symptoms do not develop -You do not necessarily need to be tested for COVID if you have + exposure and  develop symptoms. Just isolate at home x10 days from symptom onset During this global pandemic, CDC advises to practice social distancing, try to stay at least 32ft away from others at all times. Wear a face covering. Wash and sanitize your hands regularly and avoid going anywhere that is not necessary.  KEEP IN MIND THAT THE COVID TEST IS NOT 100% ACCURATE AND YOU SHOULD STILL DO EVERYTHING TO PREVENT POTENTIAL SPREAD OF VIRUS TO OTHERS (WEAR MASK, WEAR GLOVES, WASH HANDS AND SANITIZE REGULARLY). IF INITIAL TEST IS NEGATIVE, THIS MAY NOT MEAN YOU ARE DEFINITELY NEGATIVE. MOST ACCURATE TESTING IS DONE 5-7 DAYS AFTER EXPOSURE.   It is not advised by CDC to get re-tested after receiving a positive COVID test since you can still test positive for weeks to months after you have already cleared the virus.   *If you have not been vaccinated for COVID, I strongly suggest you consider getting vaccinated as long as there are no contraindications.      ED Prescriptions    Medication Sig Dispense Auth. Provider   albuterol (VENTOLIN HFA) 108 (90 Base) MCG/ACT inhaler Inhale 1-2 puffs into the lungs every 6 (six) hours as needed for wheezing or shortness of breath. Use with spacer 1 each Shirlee Latch, PA-C   brompheniramine-pseudoephedrine-DM 30-2-10 MG/5ML syrup Take 10 mLs by mouth 4 (four) times daily as needed for up to 7 days. 150 mL Eusebio Friendly B, PA-C    ipratropium (ATROVENT) 0.06 % nasal spray Place 2 sprays into both nostrils 4 (four) times daily. 15 mL Eusebio Friendly B, PA-C   ibuprofen (ADVIL) 800 MG tablet Take 1 tablet (800 mg total) by mouth 3 (three) times daily. 21 tablet Shirlee Latch, PA-C     PDMP not reviewed this encounter.   Shirlee Latch, PA-C 04/08/20 1932

## 2020-04-09 LAB — SARS CORONAVIRUS 2 (TAT 6-24 HRS): SARS Coronavirus 2: POSITIVE — AB

## 2020-07-01 ENCOUNTER — Other Ambulatory Visit: Payer: Self-pay

## 2020-07-01 ENCOUNTER — Ambulatory Visit
Admission: EM | Admit: 2020-07-01 | Discharge: 2020-07-01 | Disposition: A | Discharge status: Home / Self Care | Payer: 59 | Attending: Family Medicine | Admitting: Family Medicine

## 2020-07-01 DIAGNOSIS — Z79899 Other long term (current) drug therapy: Secondary | ICD-10-CM | POA: Insufficient documentation

## 2020-07-01 DIAGNOSIS — Z87891 Personal history of nicotine dependence: Secondary | ICD-10-CM | POA: Insufficient documentation

## 2020-07-01 DIAGNOSIS — J019 Acute sinusitis, unspecified: Secondary | ICD-10-CM

## 2020-07-01 DIAGNOSIS — Z20822 Contact with and (suspected) exposure to covid-19: Secondary | ICD-10-CM | POA: Insufficient documentation

## 2020-07-01 DIAGNOSIS — R059 Cough, unspecified: Secondary | ICD-10-CM | POA: Diagnosis present

## 2020-07-01 DIAGNOSIS — J45909 Unspecified asthma, uncomplicated: Secondary | ICD-10-CM | POA: Insufficient documentation

## 2020-07-01 DIAGNOSIS — R0981 Nasal congestion: Secondary | ICD-10-CM

## 2020-07-01 DIAGNOSIS — R0982 Postnasal drip: Secondary | ICD-10-CM

## 2020-07-01 MED ORDER — AMOXICILLIN-POT CLAVULANATE 875-125 MG PO TABS: 1.0000 | ORAL_TABLET | Freq: Two times a day (BID) | ORAL | 0 refills | Status: AC

## 2020-07-01 MED ORDER — PREDNISONE 20 MG PO TABS: 20.0000 mg | ORAL_TABLET | Freq: Every day | ORAL | 0 refills | Status: AC

## 2020-07-01 MED ORDER — PSEUDOEPH-BROMPHEN-DM 30-2-10 MG/5ML PO SYRP: 10.0000 mL | ORAL_SOLUTION | Freq: Four times a day (QID) | ORAL | 0 refills | Status: AC | PRN

## 2020-07-01 MED ORDER — FLUTICASONE PROPIONATE 50 MCG/ACT NA SUSP: 2.0000 | Freq: Every day | NASAL | 0 refills | Status: DC

## 2020-07-01 NOTE — ED Provider Notes (Signed)
[facility]    CSN: [id number] Arrival date & time: 07/01/20  1852      History   Chief Complaint Chief Complaint  Patient presents with  . Generalized Body Aches  . Headache    HPI Jared English is a 30 y.o. male presenting for 2-day history of fatigue, nasal congestion that is productive of yellow-green mucus, sinus pain and headaches.  Patient states that he feels pain into his teeth and behind his eyes.  He has also had body aches, postnasal drainage, chills.  Patient admits to history of sinus infections and has had a septoplasty and turbinate reduction in the past.  He says that he had the surgery 3 years ago and has been doing pretty well since.  He has positive for COVID-19 3 months ago when I saw him last.  He says that he was sick for about 5 or 6 days with that.  Patient states that he felt worse when he had COVID-19 but than what he does currently.  He also admits to a history of seasonal allergies.  Patient has taken over-the-counter Mucinex, Tylenol and Allegra without improvement in symptoms.  He says that he feels worse today than he did yesterday.  He has no other concerns.  HPI  Past Medical History:  Diagnosis Date  . Asthma    childhood  . GERD (gastroesophageal reflux disease)   . Headache    sinus    There are no problems to display for this patient.   Past Surgical History:  Procedure Laterality Date  . APPENDECTOMY    . SEPTOPLASTY N/A 12/22/2017   Procedure: SEPTOPLASTY;  Surgeon: Vernie Murders, MD;  Location: [facility];  Service: ENT;  Laterality: N/A;  . TURBINATE REDUCTION Bilateral 12/22/2017   Procedure: BILATERAL INFERIOR TURBINATE REDUCTION;  Surgeon: Vernie Murders, MD;  Location: [facility];  Service: ENT;  Laterality: Bilateral;       Home Medications    Prior to Admission medications   Medication Sig Start Date End Date Taking? Authorizing Provider  amoxicillin-clavulanate (AUGMENTIN) 875-125 MG tablet  Take 1 tablet by mouth every 12 (twelve) hours for 7 days. 07/01/20 07/08/20 Yes Eusebio Friendly B, PA-C  brompheniramine-pseudoephedrine-DM 30-2-10 MG/5ML syrup Take 10 mLs by mouth 4 (four) times daily as needed for up to 7 days. 07/01/20 07/08/20 Yes Eusebio Friendly B, PA-C  fluticasone (FLONASE) 50 MCG/ACT nasal spray Place 2 sprays into both nostrils daily for 15 days. 07/01/20 07/16/20 Yes Shirlee Latch, PA-C  predniSONE (DELTASONE) 20 MG tablet Take 1 tablet (20 mg total) by mouth daily for 5 days. 07/01/20 07/06/20 Yes Shirlee Latch, PA-C  albuterol (VENTOLIN HFA) 108 (90 Base) MCG/ACT inhaler Inhale 1-2 puffs into the lungs every 6 (six) hours as needed for wheezing or shortness of breath. Use with spacer 04/08/20 04/08/21  Shirlee Latch, PA-C  Cholecalciferol (VITAMIN D PO) Take by mouth daily.    [provider]  esomeprazole (NEXIUM) 20 MG capsule Take 20 mg by mouth daily at 12 noon.    [provider]  ibuprofen (ADVIL) 800 MG tablet Take 1 tablet (800 mg total) by mouth 3 (three) times daily. 04/08/20   Eusebio Friendly B, PA-C  ipratropium (ATROVENT) 0.06 % nasal spray Place 2 sprays into both nostrils 4 (four) times daily. 04/08/20   Shirlee Latch PA-C    Family History History reviewed. No pertinent family history.  Social History Social History   Tobacco Use  . Smoking  status: Former Smoker    Packs/day: 1.00    Years: 10.00    Pack years: 10.00    Types: Cigarettes    Quit date: 03/2017    Years since quitting: 3.2  . Smokeless tobacco: Never Used  Vaping Use  . Vaping Use: Former  Substance Use Topics  . Alcohol use: Yes    Alcohol/week: 3.0 standard drinks    Types: 3 Cans of beer per week  . Drug use: Not Currently     Allergies   Patient has no known allergies.   Review of Systems Review of Systems  Constitutional: Positive for chills and fatigue. Negative for fever.  HENT: Positive for congestion, postnasal drip, rhinorrhea, sinus pressure,  sinus pain and sore throat.   Respiratory: Positive for cough. Negative for shortness of breath.   Gastrointestinal: Negative for abdominal pain, diarrhea, nausea and vomiting.  Musculoskeletal: Positive for myalgias.  Neurological: Positive for headaches. Negative for weakness and light-headedness.  Hematological: Negative for adenopathy.     Physical Exam Triage Vital Signs ED Triage Vitals  Enc Vitals Group     BP 07/01/20 1908 128/90     Pulse Rate 07/01/20 1908 84     Resp 07/01/20 1908 16     Temp 07/01/20 1908 98 F (36.7 C)     Temp Source 07/01/20 1908 Oral     SpO2 07/01/20 1908 98 %     Weight 07/01/20 1905 155 lb (70.3 kg)     Height 07/01/20 1905 5\' 6"  (1.676 m)     Head Circumference --      Peak Flow --      Pain Score 07/01/20 1905 6     Pain Loc --      Pain Edu? --      Excl. in GC? --    No data found.  Updated Vital Signs BP 128/90 (BP Location: Left Arm)   Pulse 84   Temp 98 F (36.7 C) (Oral)   Resp 16   Ht 5\' 6"  (1.676 m)   Wt 155 lb (70.3 kg)   SpO2 98%   BMI 25.02 kg/m       Physical Exam Vitals and nursing note reviewed.  Constitutional:      General: He is not in acute distress.    Appearance: Normal appearance. He is well-developed. He is ill-appearing. He is not diaphoretic.  HENT:     Head: Normocephalic and atraumatic.     Right Ear: Tympanic membrane, ear canal and external ear normal.     Left Ear: Tympanic membrane, ear canal and external ear normal.     Nose: Congestion and rhinorrhea present.     Right Sinus: Maxillary sinus tenderness present.     Left Sinus: Maxillary sinus tenderness present.     Mouth/Throat:     Pharynx: Uvula midline. Posterior oropharyngeal erythema present. No oropharyngeal exudate.     Tonsils: No tonsillar abscesses.  Eyes:     General: No scleral icterus.       Right eye: No discharge.        Left eye: No discharge.     Conjunctiva/sclera: Conjunctivae normal.     Pupils: Pupils are equal,  round, and reactive to light.  Neck:     Thyroid: No thyromegaly.     Trachea: No tracheal deviation.  Cardiovascular:     Rate and Rhythm: Normal rate and regular rhythm.     Heart sounds: Normal heart sounds.  Pulmonary:  Effort: Pulmonary effort is normal. No respiratory distress.     Breath sounds: Normal breath sounds. No wheezing or rales.  Musculoskeletal:     Cervical back: Normal range of motion and neck supple.  Lymphadenopathy:     Cervical: No cervical adenopathy.  Skin:    General: Skin is warm and dry.     Findings: No rash.  Neurological:     General: No focal deficit present.     Mental Status: He is alert. Mental status is at baseline.     Motor: No weakness.     Gait: Gait normal.  Psychiatric:        Mood and Affect: Mood normal.        Behavior: Behavior normal.        Thought Content: Thought content normal.      UC Treatments / Results  Labs (all labs ordered are listed, but only abnormal results are displayed) Labs Reviewed  SARS CORONAVIRUS 2 (TAT 6-24 HRS)    EKG   Radiology No results found.  Procedures Procedures (including critical care time)  Medications Ordered in UC Medications - No data to display  Initial Impression / Assessment and Plan / UC Course  I have reviewed the triage vital signs and the nursing notes.  Pertinent labs & imaging results that were available during my care of the patient were reviewed by me and considered in my medical decision making (see chart for details).   30 year old male presenting for 2-day history of acute sinusitis symptoms that are most likely related to his allergies.  States he has had sinus surgery in the past, so I have printed a prescription for Augmentin for him to fill if his primary regimen does not work.  I have sent prescriptions for Bromfed-DM, fluticasone nasal spray, and prednisone.  Advised him to take this medications first and his symptoms should improve.  Advised if they do not  after he finished the prednisone then he should start the Augmentin and complete full course.  He declines flu or strep testing.  Covid test obtained.  Current CDC guidance, isolation protocol and ED precautions reviewed patient.  Advised unlikely that he would test positive for COVID-19 again, but someone will call if he does.  Work note given.   Final Clinical Impressions(s) / UC Diagnoses   Final diagnoses:  Acute sinusitis, recurrence not specified, unspecified location  Nasal congestion  Cough  Post-nasal drainage     Discharge Instructions     Your sinus infection is likely due to allergies.  I have sent a cough medication to the pharmacy that has a nasal decongestant and something to help with the allergies.  Have also sent a nasal spray and prednisone.  Wait a few days and if you are not feeling better can start the antibiotic, but I do not think you will need it.  Someone will call if the Covid test is positive but I do not anticipate it will be since you just had Covid 3 months ago.    ED Prescriptions    Medication Sig Dispense Auth. Provider   brompheniramine-pseudoephedrine-DM 30-2-10 MG/5ML syrup Take 10 mLs by mouth 4 (four) times daily as needed for up to 7 days. 150 mL Eusebio Friendly B, PA-C   fluticasone (FLONASE) 50 MCG/ACT nasal spray Place 2 sprays into both nostrils daily for 15 days. 1 g Eusebio Friendly B, PA-C   predniSONE (DELTASONE) 20 MG tablet Take 1 tablet (20 mg total) by mouth daily for  5 days. 5 tablet Eusebio FriendlyEaves, Johannah Rozas B, PA-C   amoxicillin-clavulanate (AUGMENTIN) 875-125 MG tablet Take 1 tablet by mouth every 12 (twelve) hours for 7 days. 14 tablet Gareth MorganEaves, Sharrieff Spratlin B, PA-C     PDMP not reviewed this encounter.   Shirlee Latchaves, Kosta Schnitzler B, PA-C 07/01/20 1945

## 2020-07-01 NOTE — ED Triage Notes (Signed)
Pt reports he thinks he has a sinus infection. Headache for 2 days, blowing green from his nose, cough, and today started with body aches.

## 2020-07-01 NOTE — Discharge Instructions (Signed)
Your sinus infection is likely due to allergies.  I have sent a cough medication to the pharmacy that has a nasal decongestant and something to help with the allergies.  Have also sent a nasal spray and prednisone.  Wait a few days and if you are not feeling better can start the antibiotic, but I do not think you will need it.  Someone will call if the Covid test is positive but I do not anticipate it will be since you just had Covid 3 months ago.

## 2020-07-02 LAB — SARS CORONAVIRUS 2 (TAT 6-24 HRS): SARS Coronavirus 2: NEGATIVE

## 2021-06-02 ENCOUNTER — Ambulatory Visit
Admission: EM | Admit: 2021-06-02 | Discharge: 2021-06-02 | Disposition: A | Discharge status: Home / Self Care | Payer: 59 | Attending: Emergency Medicine | Admitting: Emergency Medicine

## 2021-06-02 ENCOUNTER — Ambulatory Visit (INDEPENDENT_AMBULATORY_CARE_PROVIDER_SITE_OTHER): Payer: 59

## 2021-06-02 ENCOUNTER — Other Ambulatory Visit: Payer: Self-pay

## 2021-06-02 DIAGNOSIS — M25571 Pain in right ankle and joints of right foot: Secondary | ICD-10-CM | POA: Diagnosis not present

## 2021-06-02 DIAGNOSIS — S63501A Unspecified sprain of right wrist, initial encounter: Secondary | ICD-10-CM

## 2021-06-02 DIAGNOSIS — M79644 Pain in right finger(s): Secondary | ICD-10-CM | POA: Diagnosis not present

## 2021-06-02 DIAGNOSIS — S93401A Sprain of unspecified ligament of right ankle, initial encounter: Secondary | ICD-10-CM | POA: Diagnosis not present

## 2021-06-02 DIAGNOSIS — M79641 Pain in right hand: Secondary | ICD-10-CM | POA: Diagnosis not present

## 2021-06-02 DIAGNOSIS — W19XXXA Unspecified fall, initial encounter: Secondary | ICD-10-CM

## 2021-06-02 DIAGNOSIS — S66411A Strain of intrinsic muscle, fascia and tendon of right thumb at wrist and hand level, initial encounter: Secondary | ICD-10-CM
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
## 2021-06-02 NOTE — Discharge Instructions (Addendum)
Your xrays were negative for fracture, wear wrist brace/ace wrap for support/comfort. Rest,ice,elevate,wear ace wrap, follow up with PCP/Ortho(worker's Comp) if symptoms not improving. May take OTC tylenol/ibuprofen wt based dose as label directed.  ?

## 2022-03-09 ENCOUNTER — Encounter: Payer: Self-pay | Admitting: Otolaryngology

## 2022-03-11 ENCOUNTER — Encounter: Payer: Self-pay | Admitting: Otolaryngology

## 2022-03-11 ENCOUNTER — Other Ambulatory Visit: Payer: Self-pay

## 2022-03-11 ENCOUNTER — Ambulatory Visit
Admission: RE | Admit: 2022-03-11 | Discharge: 2022-03-11 | Disposition: A | Discharge status: Home / Self Care | Payer: 59 | Attending: Otolaryngology | Admitting: Otolaryngology

## 2022-03-11 ENCOUNTER — Ambulatory Visit: Payer: 59 | Admitting: Anesthesiology

## 2022-03-11 ENCOUNTER — Encounter
Admission: RE | Disposition: A | Discharge status: Home / Self Care | Payer: Self-pay | Source: Home / Self Care | Attending: Otolaryngology

## 2022-03-11 DIAGNOSIS — R519 Headache, unspecified: Secondary | ICD-10-CM | POA: Diagnosis not present

## 2022-03-11 DIAGNOSIS — Z87891 Personal history of nicotine dependence: Secondary | ICD-10-CM | POA: Insufficient documentation

## 2022-03-11 DIAGNOSIS — K219 Gastro-esophageal reflux disease without esophagitis: Secondary | ICD-10-CM | POA: Insufficient documentation

## 2022-03-11 DIAGNOSIS — J988 Other specified respiratory disorders: Secondary | ICD-10-CM | POA: Diagnosis not present

## 2022-03-11 DIAGNOSIS — W19XXXA Unspecified fall, initial encounter: Secondary | ICD-10-CM | POA: Diagnosis not present

## 2022-03-11 DIAGNOSIS — S022XXA Fracture of nasal bones, initial encounter for closed fracture: Secondary | ICD-10-CM | POA: Insufficient documentation

## 2022-03-11 DIAGNOSIS — S0121XD Laceration without foreign body of nose, subsequent encounter: Secondary | ICD-10-CM | POA: Diagnosis not present

## 2022-03-11 HISTORY — PX: CLOSED REDUCTION NASAL FRACTURE: SHX5365

## 2022-03-11 SURGERY — CLOSED REDUCTION, FRACTURE, NASAL BONE
Anesthesia: General | Laterality: Bilateral

## 2022-03-11 MED ORDER — ONDANSETRON HCL 4 MG/2ML IJ SOLN
INTRAMUSCULAR | Status: DC | PRN
  Administered 2022-03-11: 4 mg via INTRAVENOUS

## 2022-03-11 MED ORDER — ROCURONIUM BROMIDE 100 MG/10ML IV SOLN
INTRAVENOUS | Status: DC | PRN
  Administered 2022-03-11: 50 mg via INTRAVENOUS

## 2022-03-11 MED ORDER — HYDROCODONE-ACETAMINOPHEN 5-325 MG PO TABS: 1.0000 | ORAL_TABLET | Freq: Four times a day (QID) | ORAL | 0 refills | Status: AC | PRN

## 2022-03-11 MED ORDER — PHENYLEPHRINE HCL 0.5 % NA SOLN
NASAL | Status: DC | PRN
  Administered 2022-03-11: 5 mL via NASAL

## 2022-03-11 MED ORDER — OXYCODONE HCL 5 MG/5ML PO SOLN: 5.0000 mg | Freq: Once | ORAL | Status: AC | PRN

## 2022-03-11 MED ORDER — LIDOCAINE HCL (CARDIAC) PF 100 MG/5ML IV SOSY
PREFILLED_SYRINGE | INTRAVENOUS | Status: DC | PRN
  Administered 2022-03-11: 80 mg via INTRAVENOUS

## 2022-03-11 MED ORDER — OXYCODONE HCL 5 MG PO TABS
5.0000 mg | ORAL_TABLET | Freq: Once | ORAL | Status: AC | PRN
  Administered 2022-03-11: 5 mg via ORAL

## 2022-03-11 MED ORDER — MIDAZOLAM HCL 5 MG/5ML IJ SOLN
INTRAMUSCULAR | Status: DC | PRN
  Administered 2022-03-11: 2 mg via INTRAVENOUS

## 2022-03-11 MED ORDER — FENTANYL CITRATE PF 50 MCG/ML IJ SOSY
25.0000 ug | PREFILLED_SYRINGE | INTRAMUSCULAR | Status: DC | PRN
  Administered 2022-03-11 (×2): 50 ug via INTRAVENOUS

## 2022-03-11 MED ORDER — FENTANYL CITRATE (PF) 100 MCG/2ML IJ SOLN
INTRAMUSCULAR | Status: DC | PRN
  Administered 2022-03-11: 100 ug via INTRAVENOUS

## 2022-03-11 MED ORDER — LACTATED RINGERS IV SOLN: INTRAVENOUS | Status: DC

## 2022-03-11 MED ORDER — SUGAMMADEX SODIUM 200 MG/2ML IV SOLN
INTRAVENOUS | Status: DC | PRN
  Administered 2022-03-11: 200 mg via INTRAVENOUS

## 2022-03-11 MED ORDER — PROPOFOL 10 MG/ML IV BOLUS
INTRAVENOUS | Status: DC | PRN
  Administered 2022-03-11: 140 mg via INTRAVENOUS
  Administered 2022-03-11: 60 mg via INTRAVENOUS

## 2022-03-11 MED ORDER — OXYMETAZOLINE HCL 0.05 % NA SOLN
2.0000 | Freq: Once | NASAL | Status: AC
  Administered 2022-03-11: 2 via NASAL

## 2022-03-11 MED ORDER — DEXAMETHASONE SODIUM PHOSPHATE 4 MG/ML IJ SOLN
INTRAMUSCULAR | Status: DC | PRN
  Administered 2022-03-11: 8 mg via INTRAVENOUS

## 2022-03-11 MED ORDER — PREDNISONE 10 MG PO TABS: ORAL_TABLET | ORAL | 0 refills | Status: DC

## 2022-03-11 MED ORDER — DEXMEDETOMIDINE HCL IN NACL 200 MCG/50ML IV SOLN
INTRAVENOUS | Status: DC | PRN
  Administered 2022-03-11: 8 ug via INTRAVENOUS

## 2022-03-11 SURGICAL SUPPLY — 19 items
BASIN GRAD PLASTIC 32OZ STRL (MISCELLANEOUS) ×1 IMPLANT
CANISTER SUCT 1200ML W/VALVE (MISCELLANEOUS) ×1 IMPLANT
COVER MAYO STAND STRL (DRAPES) ×1 IMPLANT
CUP MEDICINE 2OZ PLAST GRAD ST (MISCELLANEOUS) ×1 IMPLANT
GAUZE PETROLATUM 1 X8 (GAUZE/BANDAGES/DRESSINGS) IMPLANT
GAUZE SPONGE 4X4 12PLY STRL (GAUZE/BANDAGES/DRESSINGS) ×1 IMPLANT
GLOVE SURG GAMMEX PI TX LF 7.5 (GLOVE) ×1 IMPLANT
KIT TURNOVER KIT A (KITS) ×1 IMPLANT
PACKING EPISTAXIS NASAL BLU LR (MISCELLANEOUS) IMPLANT
PACKING SINUS BLU KSTYLE (MISCELLANEOUS) IMPLANT
PAD ALCOHOL SWAB (MISCELLANEOUS) ×1 IMPLANT
PK SINUS BLU KSTYLE (MISCELLANEOUS) ×1
SPONGE NEURO XRAY DETECT 1X3 (DISPOSABLE) ×1 IMPLANT
STRAP BODY AND KNEE 60X3 (MISCELLANEOUS) ×1 IMPLANT
STRIP CLOSURE SKIN 1/2X4 (GAUZE/BANDAGES/DRESSINGS) ×1 IMPLANT
SUT ETHILON 6 0 9-3 1X18 BLK (SUTURE) IMPLANT
TOWEL OR 17X26 4PK STRL BLUE (TOWEL DISPOSABLE) ×1 IMPLANT
TUBING CONN 6MMX3.1M (TUBING) ×1
TUBING SUCTION CONN 0.25 STRL (TUBING) ×1 IMPLANT

## 2022-03-11 NOTE — H&P (Signed)
H&P has been reviewed and patient reevaluated, no changes necessary. To be downloaded later.  

## 2022-03-11 NOTE — Anesthesia Postprocedure Evaluation (Signed)
Anesthesia Post Note  Patient: Jared English  Procedure(s) Performed: CLOSED REDUCTION NASAL FRACTURE (Bilateral)  Patient location during evaluation: PACU Anesthesia Type: General Level of consciousness: awake and alert Pain management: pain level controlled Vital Signs Assessment: post-procedure vital signs reviewed and stable Respiratory status: spontaneous breathing, nonlabored ventilation, respiratory function stable and patient connected to nasal cannula oxygen Cardiovascular status: blood pressure returned to baseline and stable Postop Assessment: no apparent nausea or vomiting Anesthetic complications: no   No notable events documented.   Last Vitals:  Vitals:   03/11/22 1236 03/11/22 1245  BP:  121/83  Pulse: 71 63  Resp: 13 19  Temp:    SpO2: 97% 97%    Last Pain:  Vitals:   03/11/22 1245  TempSrc:   PainSc: 4                  Cleda Mccreedy Nilay Mangrum

## 2022-03-11 NOTE — Anesthesia Preprocedure Evaluation (Addendum)
Anesthesia Evaluation  Patient identified by MRN, date of birth, ID band Patient awake    Reviewed: Allergy & Precautions, NPO status , Patient's Chart, lab work & pertinent test results  History of Anesthesia Complications (+) Emergence Delirium and history of anesthetic complications  Airway Mallampati: III  TM Distance: >3 FB Neck ROM: full    Dental  (+) Chipped   Pulmonary neg shortness of breath, asthma , former smoker   Pulmonary exam normal        Cardiovascular Exercise Tolerance: Good (-) angina (-) Past MI Normal cardiovascular exam     Neuro/Psych  Headaches  Neuromuscular disease  negative psych ROS   GI/Hepatic Neg liver ROS,GERD  Controlled,,  Endo/Other  negative endocrine ROS    Renal/GU      Musculoskeletal   Abdominal   Peds  Hematology negative hematology ROS (+)   Anesthesia Other Findings Past Medical History: No date: Asthma     Comment:  childhood No date: GERD (gastroesophageal reflux disease) No date: Headache     Comment:  sinus  Past Surgical History: No date: APPENDECTOMY 12/22/2017: SEPTOPLASTY; N/A     Comment:  Procedure: SEPTOPLASTY;  Surgeon: Vernie Murders, MD;                Location: [facility];  Service: ENT;                Laterality: N/A; 12/22/2017: TURBINATE REDUCTION; Bilateral     Comment:  Procedure: BILATERAL INFERIOR TURBINATE REDUCTION;                Surgeon: Vernie Murders, MD;  Location: [facility];  Service: ENT;  Laterality: Bilateral;  BMI    Body Mass Index: 25.29 kg/m      Reproductive/Obstetrics negative OB ROS                             Anesthesia Physical Anesthesia Plan  ASA: 2  Anesthesia Plan: General ETT   Post-op Pain Management:    Induction: Intravenous  PONV Risk Score and Plan: Ondansetron, Dexamethasone, Midazolam and Treatment may vary due to age or medical  condition  Airway Management Planned: Oral ETT  Additional Equipment:   Intra-op Plan:   Post-operative Plan: Extubation in OR  Informed Consent: I have reviewed the patients History and Physical, chart, labs and discussed the procedure including the risks, benefits and alternatives for the proposed anesthesia with the patient or authorized representative who has indicated his/her understanding and acceptance.     Dental Advisory Given  Plan Discussed with: Anesthesiologist, CRNA and Surgeon  Anesthesia Plan Comments: (Patient consented for risks of anesthesia including but not limited to:  - adverse reactions to medications - damage to eyes, teeth, lips or other oral mucosa - nerve damage due to positioning  - sore throat or hoarseness - Damage to heart, brain, nerves, lungs, other parts of body or loss of life  Patient voiced understanding.)       Anesthesia Quick Evaluation

## 2022-03-11 NOTE — Anesthesia Procedure Notes (Signed)
Procedure Name: Intubation Date/Time: 03/11/2022 11:00 AM  Performed by: Tobie Poet, CRNAPre-anesthesia Checklist: Patient identified, Emergency Drugs available, Suction available and Patient being monitored Patient Re-evaluated:Patient Re-evaluated prior to induction Oxygen Delivery Method: Circle system utilized Preoxygenation: Pre-oxygenation with 100% oxygen Induction Type: IV induction Ventilation: Mask ventilation without difficulty Laryngoscope Size: Mac and 3 Grade View: Grade II Tube type: Oral Tube size: 7.0 mm Number of attempts: 1 Airway Equipment and Method: Stylet and Oral airway Placement Confirmation: ETT inserted through vocal cords under direct vision, positive ETCO2 and breath sounds checked- equal and bilateral Tube secured with: Tape Dental Injury: Teeth and Oropharynx as per pre-operative assessment

## 2022-03-11 NOTE — Transfer of Care (Signed)
Immediate Anesthesia Transfer of Care Note  Patient: Jared English  Procedure(s) Performed: CLOSED REDUCTION NASAL FRACTURE (Bilateral)  Patient Location: PACU  Anesthesia Type: General ETT  Level of Consciousness: awake, alert  and patient cooperative  Airway and Oxygen Therapy: Patient Spontanous Breathing and Patient connected to supplemental oxygen  Post-op Assessment: Post-op Vital signs reviewed, Patient's Cardiovascular Status Stable, Respiratory Function Stable, Patent Airway and No signs of Nausea or vomiting  Post-op Vital Signs: Reviewed and stable  Complications: No notable events documented.

## 2022-03-12 ENCOUNTER — Encounter: Payer: Self-pay | Admitting: Otolaryngology

## 2022-03-17 NOTE — Op Note (Signed)
03/17/2022  7:39 AM    Jared English  [id number]   Pre-Op Dx: Displaced nasal fracture with airway obstruction, laceration of the nasal bridge previously sutured  Post-op Dx: Displaced nasal fracture with airway obstruction, laceration nasal bridge that reopened and required suture repair again.  Proc: Closed reduction nasal fracture with stabilization, simple repair of laceration nasal bridge of 2.2 cm in length  Surg:  Jared English  Anes:  GOT  EBL: 20 mL  Comp: None  Findings: The nasal bones were pushed to the right side.  The right nasal bone was a comminuted fracture with multiple pieces that required packing inside the nose to hold the nasal bones in their normal position. The nasal laceration had sutures removed, as this was 6 days post repair.  The wound opened up as it had no deep sutures and had to be resutured again to repair this prior to placing the cast  Procedure: The patient was brought to the operating room and placed in a supine position.  He was given general anesthesia by oral endotracheal intubation.  His nose was prepped using cotton pledgets soaked in phenylephrine and Xylocaine.  His nose was washed with some alcohol.  The sutures were removed and the wound appeared to be pulling apart somewhat at its midportion and had some fluid coming from there.  He may have had a little bit of wound infection starting here.  Cotton pledgets were removed from the nose and a [place] elevator was placed into the right nostril to elevate the side wall digital pressure was used on the left nasal bone to push this medial.  The left nasal bone pulled back into his normal position and the right nasal bone was elevated but this was in several pieces and continued to want to collapse back and work.  When the bones were pushed back over the wound completely fell apart and was an open wound to see the comminuted bone fracture on the right nasal bones.  The wound was reclosed using 6-0  nylon with sutures to evert the skin edges and pull this together to hold it.  The stitches will need to remain underneath the cast.  The wound was 2.2 cm in length.  Multiple interrupted sutures were placed.    Right nasal bone was comminuted and would not stay up.  Vaseline gauze was taken and a small piece and rolled to place it up at the upper portion of the right nasal passageway to hold the right nasal bones outward.  This gave good contour to the nose.  A sponge packing was placed underneath this in the right nostril to hold it in position at the top of the nose to hold the bones in place.  Steri-Strips were then placed over the nasal dorsum and an Aquaplast cast was placed over that.  This was then taped to the skin of the cheek.  The patient tolerated the procedure well.  He was awakened and taken to the recovery room in satisfactory condition.  There were no operative complications.  Dispo:   To PACU to be discharged home  Plan: To follow-up in the office in 6 days for removing his cast.  He can pull out his sponge pack in 4 days and the Vaseline gauze as well.  Needs to be very careful not to portion his nose as this will collapse those bones inward.  Jared English  03/17/2022 7:39 AM

## 2022-06-03 ENCOUNTER — Emergency Department
Admission: EM | Admit: 2022-06-03 | Discharge: 2022-06-03 | Disposition: A | Discharge status: Home / Self Care | Attending: Emergency Medicine | Admitting: Emergency Medicine

## 2022-06-03 ENCOUNTER — Encounter: Payer: Self-pay | Admitting: Emergency Medicine

## 2022-06-03 DIAGNOSIS — Z23 Encounter for immunization: Secondary | ICD-10-CM | POA: Insufficient documentation

## 2022-06-03 DIAGNOSIS — Y990 Civilian activity done for income or pay: Secondary | ICD-10-CM | POA: Insufficient documentation

## 2022-06-03 DIAGNOSIS — S50811A Abrasion of right forearm, initial encounter: Secondary | ICD-10-CM | POA: Insufficient documentation

## 2022-06-03 DIAGNOSIS — S59911A Unspecified injury of right forearm, initial encounter: Secondary | ICD-10-CM | POA: Diagnosis present

## 2022-06-03 DIAGNOSIS — W503XXA Accidental bite by another person, initial encounter: Secondary | ICD-10-CM | POA: Insufficient documentation

## 2022-06-03 MED ORDER — LIDOCAINE HCL (PF) 1 % IJ SOLN
5.0000 mL | Freq: Once | INTRAMUSCULAR | Status: AC
  Administered 2022-06-03: 5 mL
  Filled 2022-06-03: qty 5

## 2022-06-03 MED ORDER — TETANUS-DIPHTH-ACELL PERTUSSIS 5-2.5-18.5 LF-MCG/0.5 IM SUSY
0.5000 mL | PREFILLED_SYRINGE | Freq: Once | INTRAMUSCULAR | Status: AC
  Administered 2022-06-03: 0.5 mL via INTRAMUSCULAR
  Filled 2022-06-03: qty 0.5

## 2022-06-03 MED ORDER — AMOXICILLIN-POT CLAVULANATE 875-125 MG PO TABS
1.0000 | ORAL_TABLET | Freq: Once | ORAL | Status: AC
  Administered 2022-06-03: 1 via ORAL
  Filled 2022-06-03: qty 1

## 2022-06-03 MED ORDER — AMOXICILLIN-POT CLAVULANATE 875-125 MG PO TABS: 1.0000 | ORAL_TABLET | Freq: Two times a day (BID) | ORAL | 0 refills | Status: AC
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
## 2022-09-23 IMAGING — CR DG FINGER THUMB 2+V*R*
3 series · 3 of 3 positions shown · non-contrast
Comparison: None.

CLINICAL DATA: Fall, injury

EXAM:
RIGHT THUMB 2+V

[finger ap]
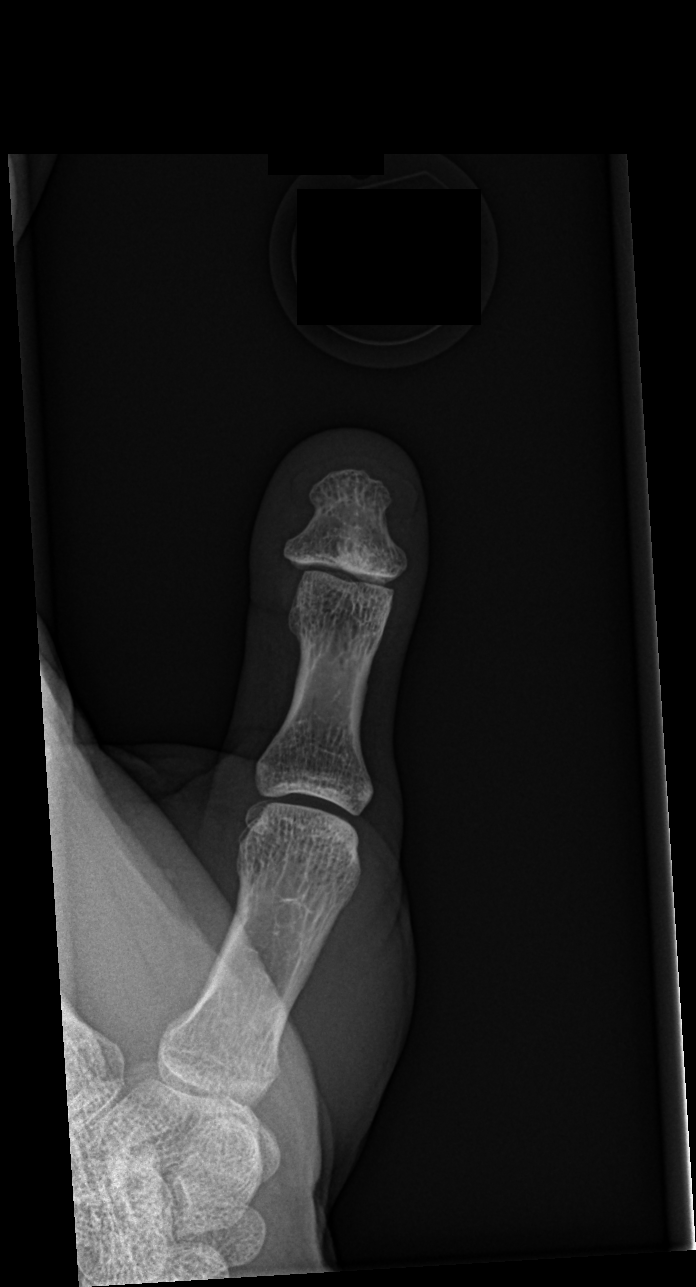

[finger obl]
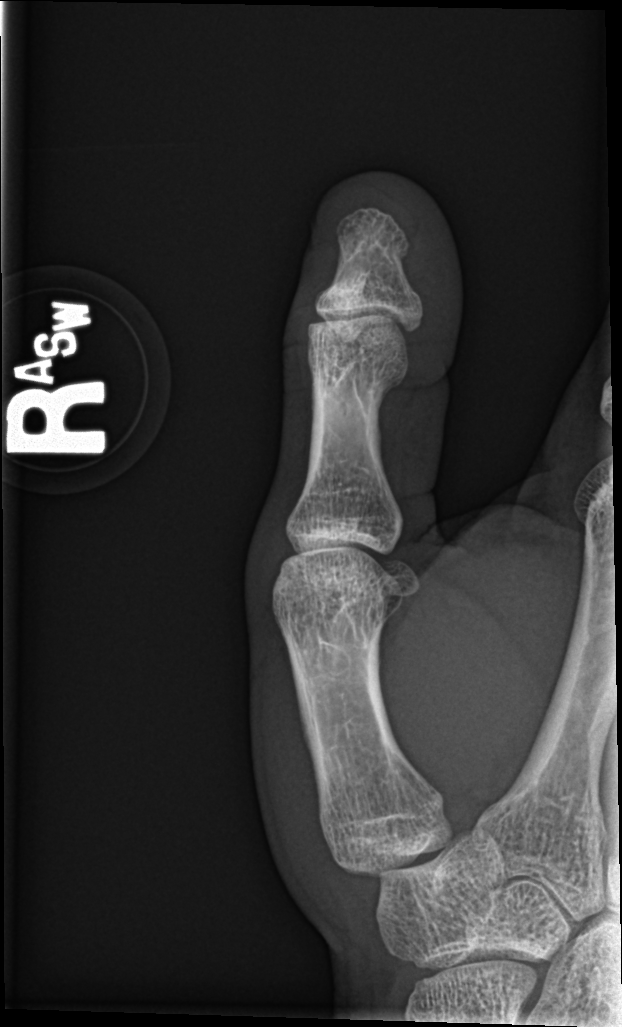

[finger lat]
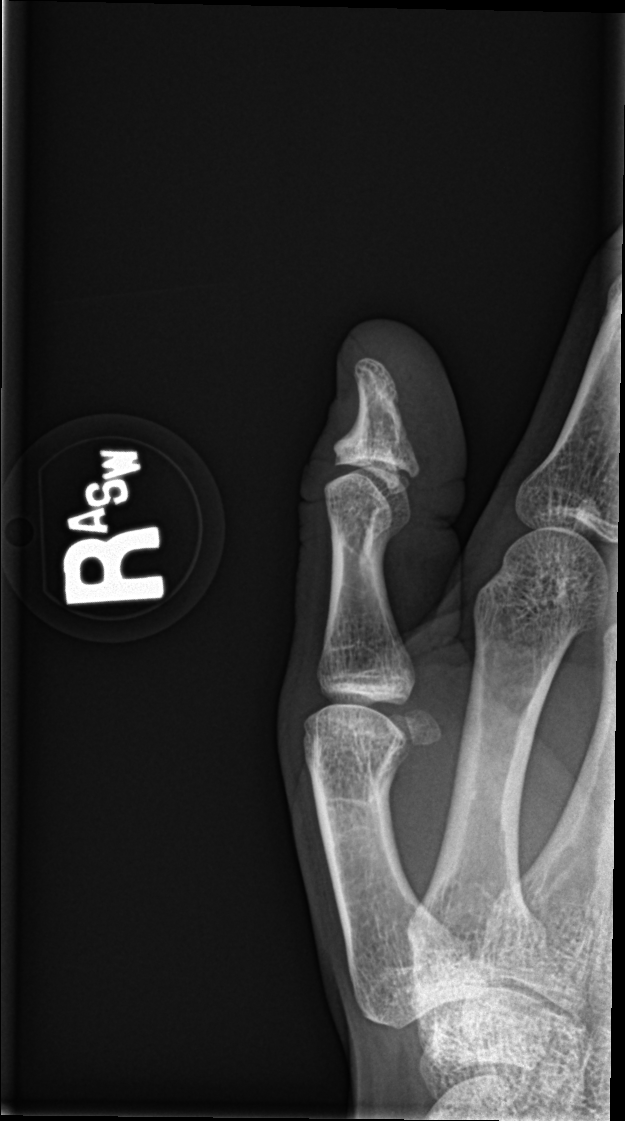

[3 of 3 positions shown; findings below may reference images not displayed]

FINDINGS: There is no evidence of acute fracture or dislocation. There is no
significant arthropathy. Alignment is normal.
IMPRESSION: No acute osseous abnormality involving the right thumb.

## 2022-09-23 IMAGING — CR DG HAND COMPLETE 3+V*R*
3 series · 3 of 3 positions shown · non-contrast
Comparison: None.

CLINICAL DATA: Fall, injury

EXAM:
RIGHT HAND - COMPLETE 3+ VIEW

[hand ap]
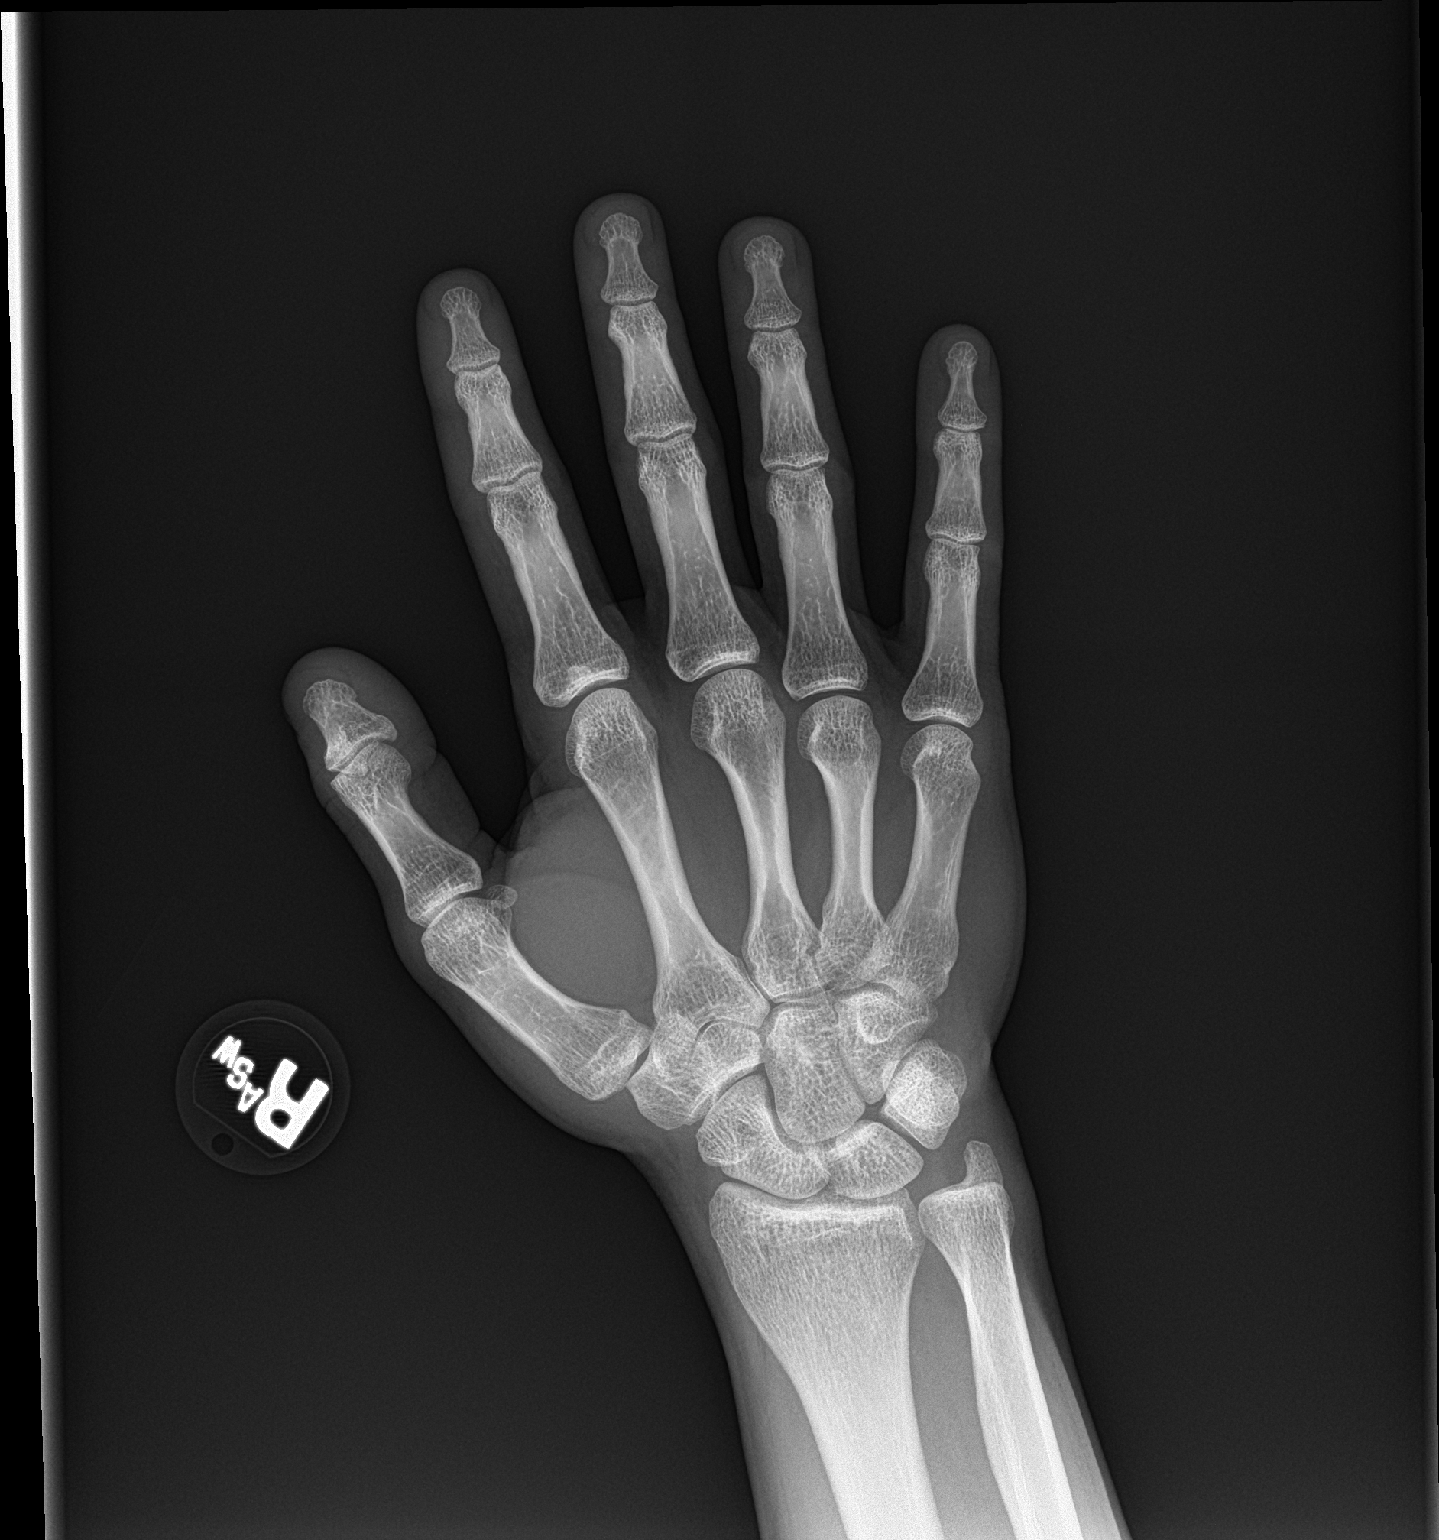

[hand obl]
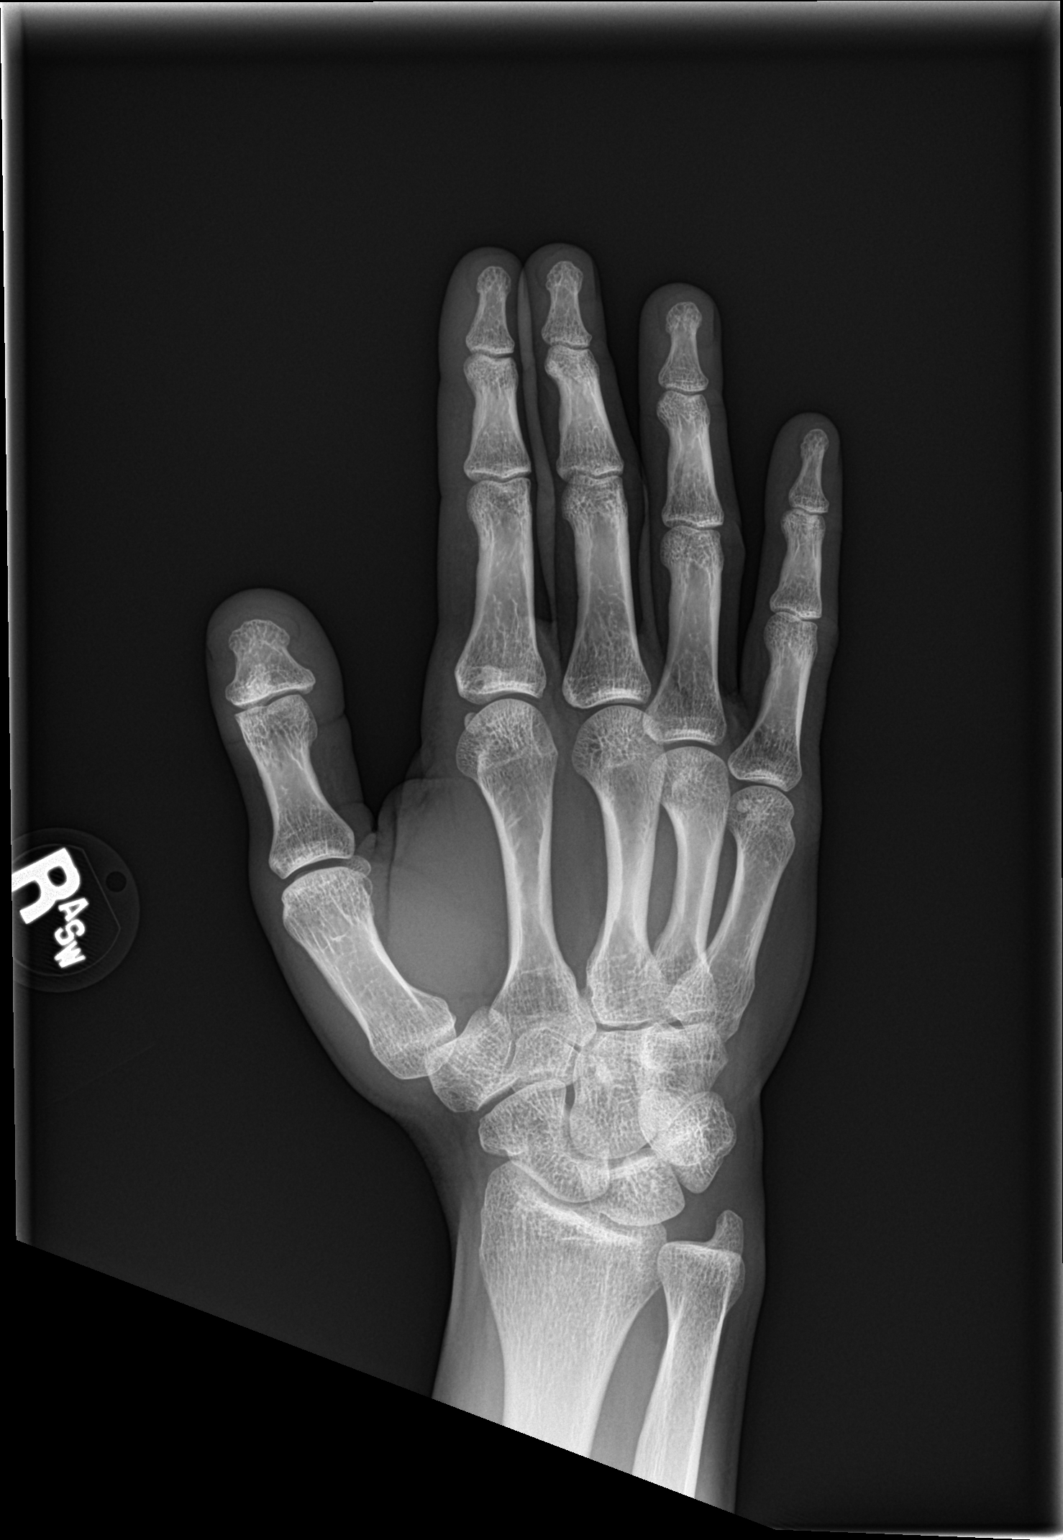

[hand lat]
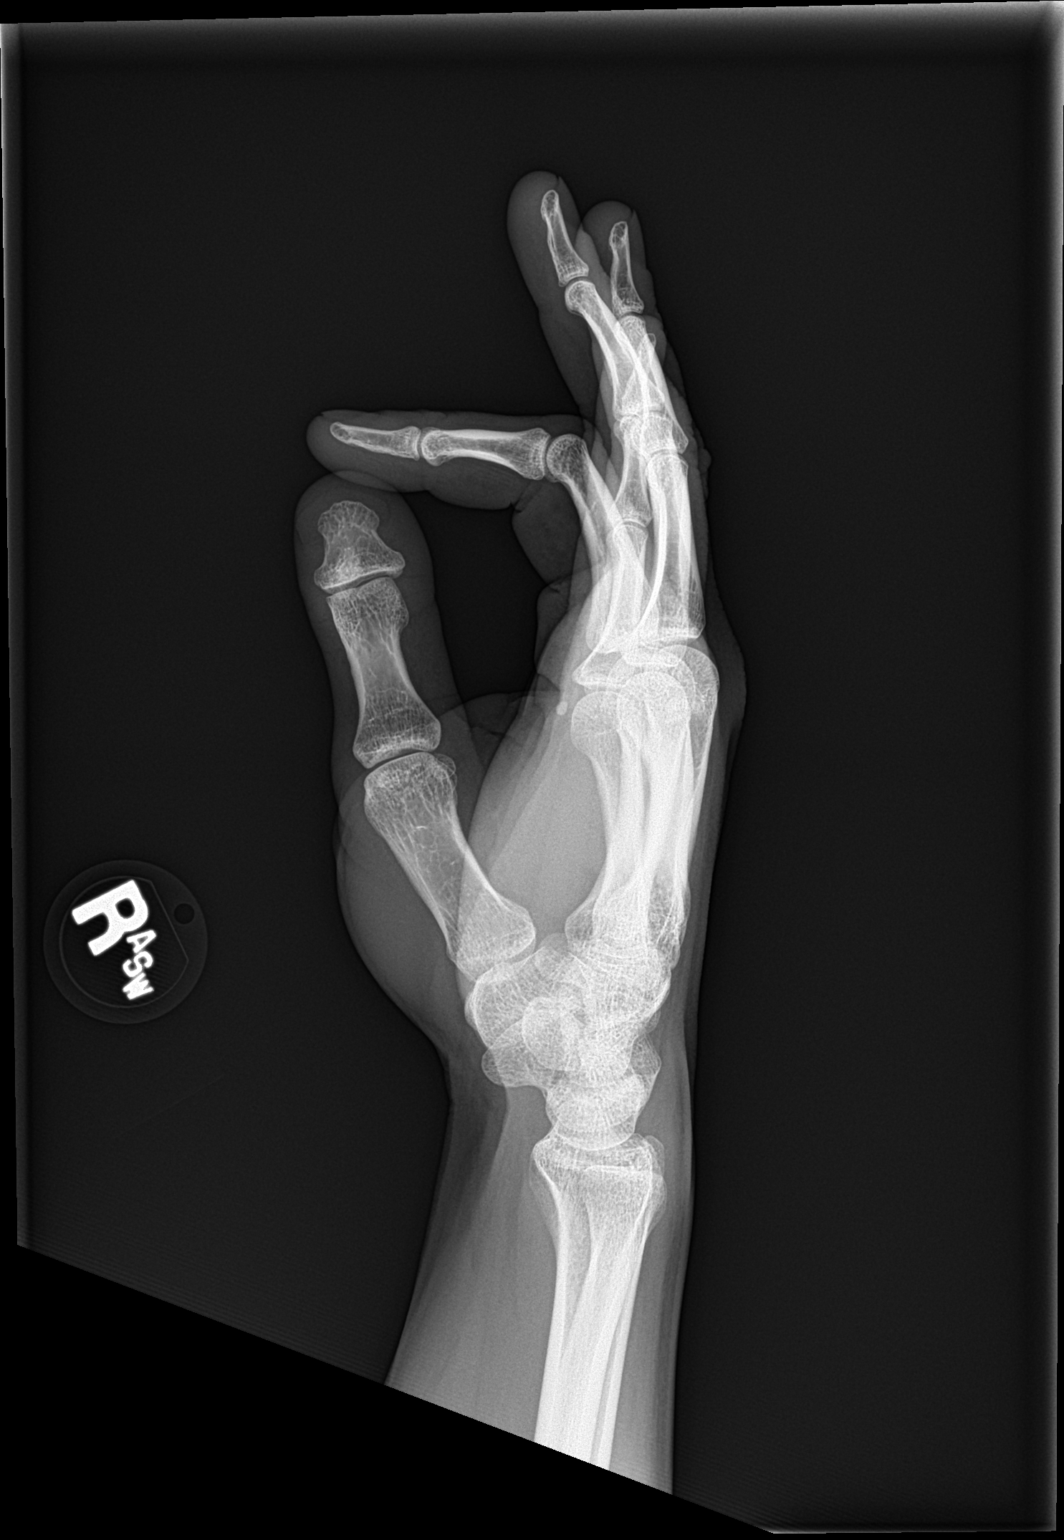

[3 of 3 positions shown; findings below may reference images not displayed]

FINDINGS: No evidence of acute fracture or dislocation. No significant
arthropathy. Normal alignment.
IMPRESSION: Negative right hand radiographs.

## 2023-01-21 ENCOUNTER — Ambulatory Visit
Admission: EM | Admit: 2023-01-21 | Discharge: 2023-01-21 | Disposition: A | Discharge status: Home / Self Care | Payer: 59 | Attending: Family Medicine | Admitting: Family Medicine

## 2023-01-21 ENCOUNTER — Encounter: Payer: Self-pay | Admitting: Emergency Medicine

## 2023-01-21 DIAGNOSIS — B9789 Other viral agents as the cause of diseases classified elsewhere: Secondary | ICD-10-CM | POA: Insufficient documentation

## 2023-01-21 DIAGNOSIS — J069 Acute upper respiratory infection, unspecified: Secondary | ICD-10-CM | POA: Insufficient documentation

## 2023-01-21 DIAGNOSIS — Z87891 Personal history of nicotine dependence: Secondary | ICD-10-CM | POA: Insufficient documentation

## 2023-01-21 DIAGNOSIS — K219 Gastro-esophageal reflux disease without esophagitis: Secondary | ICD-10-CM | POA: Diagnosis not present

## 2023-01-21 DIAGNOSIS — Z1152 Encounter for screening for COVID-19: Secondary | ICD-10-CM | POA: Insufficient documentation

## 2023-01-21 DIAGNOSIS — J029 Acute pharyngitis, unspecified: Secondary | ICD-10-CM | POA: Diagnosis present

## 2023-01-21 LAB — RESP PANEL BY RT-PCR (RSV, FLU A&B, COVID)  RVPGX2
Influenza A by PCR: NEGATIVE
Influenza B by PCR: NEGATIVE
Resp Syncytial Virus by PCR: NEGATIVE
SARS Coronavirus 2 by RT PCR: NEGATIVE

## 2023-01-21 LAB — GROUP A STREP BY PCR: Group A Strep by PCR: NOT DETECTED

## 2023-01-21 MED ORDER — PROMETHAZINE-DM 6.25-15 MG/5ML PO SYRP
5.0000 mL | ORAL_SOLUTION | Freq: Four times a day (QID) | ORAL | 0 refills | Status: AC | PRN
Start: 2023-01-21 — End: ?

## 2023-01-21 MED ORDER — BENZONATATE 100 MG PO CAPS: 200.0000 mg | ORAL_CAPSULE | Freq: Three times a day (TID) | ORAL | 0 refills | Status: AC

## 2023-01-21 MED ORDER — IPRATROPIUM BROMIDE 0.06 % NA SOLN: 2.0000 | Freq: Four times a day (QID) | NASAL | 12 refills | Status: AC

## 2023-01-21 NOTE — Discharge Instructions (Signed)
Test today were negative for COVID, flu, RSV, and strep.  I do believe you have a respiratory virus which is causing your symptoms.  Use over-the-counter Tylenol and/or ibuprofen according the package instructions as needed for any pain you may experience.  Use the Atrovent nasal spray, 2 squirts in each nostril every 6 hours, as needed for runny nose and postnasal drip.  Use the Tessalon Perles every 8 hours during the day.  Take them with a small sip of water.  They may give you some numbness to the base of your tongue or a metallic taste in your mouth, this is normal.  Use the Promethazine DM cough syrup at bedtime for cough and congestion.  It will make you drowsy so do not take it during the day.  Return for reevaluation or see your primary care provider for any new or worsening symptoms.

## 2023-01-21 NOTE — ED Provider Notes (Signed)
[facility]    CSN: [id number] Arrival date & time: 01/21/23  1051      History   Chief Complaint Chief Complaint  Patient presents with   Sore Throat   Generalized Body Aches   Cough    HPI Jared English is a 32 y.o. male.   HPI  32 year old male with a past medical history significant for childhood asthma, GERD, and headaches presents for evaluation of acute onset respiratory symptoms to include nasal congestion, sore throat, and a nonproductive cough.  He denies any fever, shortness of breath, or wheezing.  Past Medical History:  Diagnosis Date   Asthma    childhood   GERD (gastroesophageal reflux disease)    Headache    sinus    Patient Active Problem List   Diagnosis Date Noted   Sprain of right ankle 06/02/2021   Right wrist sprain, initial encounter 06/02/2021   Strain of intrinsic muscle of right thumb 06/02/2021   Fall 06/02/2021    Past Surgical History:  Procedure Laterality Date   APPENDECTOMY     CLOSED REDUCTION NASAL FRACTURE Bilateral 03/11/2022   Procedure: CLOSED REDUCTION NASAL FRACTURE;  Surgeon: Vernie Murders, MD;  Location: [facility];  Service: ENT;  Laterality: Bilateral;   SEPTOPLASTY N/A 12/22/2017   Procedure: SEPTOPLASTY;  Surgeon: Vernie Murders, MD;  Location: [facility];  Service: ENT;  Laterality: N/A;   TURBINATE REDUCTION Bilateral 12/22/2017   Procedure: BILATERAL INFERIOR TURBINATE REDUCTION;  Surgeon: Vernie Murders, MD;  Location: [facility];  Service: ENT;  Laterality: Bilateral;       Home Medications    Prior to Admission medications   Medication Sig Start Date End Date Taking? Authorizing Provider  benzonatate (TESSALON) 100 MG capsule Take 2 capsules (200 mg total) by mouth every 8 (eight) hours. 01/21/23  Yes Becky Augusta, NP  esomeprazole (NEXIUM) 20 MG capsule Take 20 mg by mouth daily at 12 noon.   Yes [provider]  ipratropium (ATROVENT) 0.06 % nasal spray Place 2  sprays into both nostrils 4 (four) times daily. 01/21/23  Yes Becky Augusta, NP  promethazine-dextromethorphan (PROMETHAZINE-DM) 6.25-15 MG/5ML syrup Take 5 mLs by mouth 4 (four) times daily as needed. 01/21/23  Yes Becky Augusta, NP  Cholecalciferol (VITAMIN D PO) Take by mouth daily.    [provider]    Family History History reviewed. No pertinent family history.  Social History Social History   Tobacco Use   Smoking status: Former    Current packs/day: 0.00    Average packs/day: 1 pack/day for 10.0 years (10.0 ttl pk-yrs)    Types: Cigarettes    Start date: 03/2007    Quit date: 03/2017    Years since quitting: 5.8   Smokeless tobacco: Never  Vaping Use   Vaping status: Some Days   Substances: Nicotine  Substance Use Topics   Alcohol use: Yes    Alcohol/week: 3.0 standard drinks of alcohol    Types: 3 Cans of beer per week   Drug use: Not Currently     Allergies   Patient has no known allergies.   Review of Systems Review of Systems  Constitutional:  Negative for fever.  HENT:  Positive for congestion and sore throat. Negative for rhinorrhea.   Respiratory:  Positive for cough. Negative for shortness of breath and wheezing.      Physical Exam Triage Vital Signs ED Triage Vitals  Encounter Vitals Group     BP 01/21/23 1110 132/88  Systolic BP Percentile --      Diastolic BP Percentile --      Pulse Rate 01/21/23 1110 76     Resp 01/21/23 1110 15     Temp 01/21/23 1110 98.1 F (36.7 C)     Temp Source 01/21/23 1110 Oral     SpO2 01/21/23 1110 99 %     Weight 01/21/23 1108 155 lb (70.3 kg)     Height 01/21/23 1108 5\' 5"  (1.651 m)     Head Circumference --      Peak Flow --      Pain Score 01/21/23 1108 4     Pain Loc --      Pain Education --      Exclude from Growth Chart --    No data found.  Updated Vital Signs BP 132/88 (BP Location: Left Arm)   Pulse 76   Temp 98.1 F (36.7 C) (Oral)   Resp 15   Ht 5\' 5"  (1.651 m)   Wt 155 lb  (70.3 kg)   SpO2 99%   BMI 25.79 kg/m   Visual Acuity Right Eye Distance:   Left Eye Distance:   Bilateral Distance:    Right Eye Near:   Left Eye Near:    Bilateral Near:     Physical Exam Vitals and nursing note reviewed.  Constitutional:      Appearance: Normal appearance. He is not ill-appearing.  HENT:     Head: Normocephalic and atraumatic.     Right Ear: Tympanic membrane, ear canal and external ear normal. There is no impacted cerumen.     Left Ear: Tympanic membrane, ear canal and external ear normal. There is no impacted cerumen.     Nose: Congestion and rhinorrhea present.     Comments: Patient mucosa is erythematous and edematous with scant clear discharge in both nares.    Mouth/Throat:     Mouth: Mucous membranes are moist.     Pharynx: Oropharynx is clear. Posterior oropharyngeal erythema present. No oropharyngeal exudate.     Comments: Soft palate and uvula are erythematous, as are bilateral tonsillar pillars.  No appreciable tonsillar edema or exudate noted.  Posterior oropharynx also demonstrates erythema with clear postnasal drip. Cardiovascular:     Rate and Rhythm: Normal rate and regular rhythm.     Pulses: Normal pulses.     Heart sounds: Normal heart sounds. No murmur heard.    No friction rub. No gallop.  Pulmonary:     Effort: Pulmonary effort is normal.     Breath sounds: Normal breath sounds. No wheezing, rhonchi or rales.  Musculoskeletal:     Cervical back: Normal range of motion and neck supple.  Lymphadenopathy:     Cervical: No cervical adenopathy.  Skin:    General: Skin is warm and dry.     Capillary Refill: Capillary refill takes less than 2 seconds.  Neurological:     General: No focal deficit present.     Mental Status: He is alert and oriented to person, place, and time.      UC Treatments / Results  Labs (all labs ordered are listed, but only abnormal results are displayed) Labs Reviewed  GROUP A STREP BY PCR  RESP PANEL  BY RT-PCR (RSV, FLU A&B, COVID)  [id number]    EKG   Radiology No results found.  Procedures Procedures (including critical care time)  Medications Ordered in UC Medications - No data to display  Initial Impression / Assessment and Plan /  UC Course  I have reviewed the triage vital signs and the nursing notes.  Pertinent labs & imaging results that were available during my care of the patient were reviewed by me and considered in my medical decision making (see chart for details).   Patient is a nontoxic-appearing 32 year old male presenting for evaluation of respiratory symptoms outlined HPI above.  He does have inflamed nasal mucosa with clear rhinorrhea and clear postnasal drip.  He also has erythema of his soft palate bilateral tonsillar pillars but no appreciable exudate.  Cardiopulmonary dam is benign.  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  Also a strep PCR given his sore throat.  Strep PCR is negative.  Rester panel is negative for COVID, influenza, or RSV.  I will discharge patient home with a diagnosis of viral URI with a cough with prescription retronasal spray, Tessalon Perles, Promethazine DM cough syrup.  Return precautions reviewed.   Final Clinical Impressions(s) / UC Diagnoses   Final diagnoses:  Viral URI with cough     Discharge Instructions      Test today were negative for COVID, flu, RSV, and strep.  I do believe you have a respiratory virus which is causing your symptoms.  Use over-the-counter Tylenol and/or ibuprofen according the package instructions as needed for any pain you may experience.  Use the Atrovent nasal spray, 2 squirts in each nostril every 6 hours, as needed for runny nose and postnasal drip.  Use the Tessalon Perles every 8 hours during the day.  Take them with a small sip of water.  They may give you  some numbness to the base of your tongue or a metallic taste in your mouth, this is normal.  Use the Promethazine DM cough syrup at bedtime for cough and congestion.  It will make you drowsy so do not take it during the day.  Return for reevaluation or see your primary care provider for any new or worsening symptoms.      ED Prescriptions     Medication Sig Dispense Auth. Provider   benzonatate (TESSALON) 100 MG capsule Take 2 capsules (200 mg total) by mouth every 8 (eight) hours. 21 capsule Becky Augusta, NP   ipratropium (ATROVENT) 0.06 % nasal spray Place 2 sprays into both nostrils 4 (four) times daily. 15 mL Becky Augusta, NP   promethazine-dextromethorphan (PROMETHAZINE-DM) 6.25-15 MG/5ML syrup Take 5 mLs by mouth 4 (four) times daily as needed. 118 mL Becky Augusta, NP      PDMP not reviewed this encounter.   Becky Augusta, NP 01/21/23 1159

## 2023-01-21 NOTE — ED Triage Notes (Signed)
Patient c/o cough, nasal congestion, sore throat that started late last night.  Patient unsure of fevers.

## 2023-06-23 ENCOUNTER — Ambulatory Visit
Admission: EM | Admit: 2023-06-23 | Discharge: 2023-06-23 | Disposition: A | Discharge status: Home / Self Care | Attending: Emergency Medicine | Admitting: Emergency Medicine

## 2023-06-23 ENCOUNTER — Ambulatory Visit (INDEPENDENT_AMBULATORY_CARE_PROVIDER_SITE_OTHER)

## 2023-06-23 ENCOUNTER — Encounter: Payer: Self-pay | Admitting: *Deleted

## 2023-06-23 DIAGNOSIS — R0789 Other chest pain: Secondary | ICD-10-CM | POA: Diagnosis not present

## 2023-06-23 DIAGNOSIS — S299XXA Unspecified injury of thorax, initial encounter: Secondary | ICD-10-CM

## 2023-06-23 NOTE — ED Triage Notes (Signed)
 Patient states he was playing football with friends this weekend, took a hard hit, heard something pop in chest and is having left sided pain under the arm, hurts to take a deep breath and cough.

## 2023-06-23 NOTE — Discharge Instructions (Addendum)
 CXR is normal, negative for fracture or pneumothorax.  May use over the counter tylenol,ibuprofen,lidocaine gel/patch/biofreeze as label directed. Follow up with PCP, If you have new or worsening symptoms(chest pain, shortness of breath, palpitations, etc.) go to ER for further evaluation

## 2023-06-23 NOTE — ED Provider Notes (Signed)
 [facility]    CSN: [id number] Arrival date & time: 06/23/23  1836      History   Chief Complaint Chief Complaint  Patient presents with   Rib Injury    HPI Jared English is a 33 y.o. male.   33 year old male pt, Jared English, presents to urgent care for evaluation of left-sided rib pain after injuring playing football over the weekend(Saturday).  Patient states he has taken over-the-counter meds without relief, no bruising  The history is provided by the patient. No language interpreter was used.    Past Medical History:  Diagnosis Date   Asthma    childhood   GERD (gastroesophageal reflux disease)    Headache    sinus    Patient Active Problem List   Diagnosis Date Noted   Rib injury 06/23/2023   Chest wall pain 06/23/2023   Sprain of right ankle 06/02/2021   Right wrist sprain, initial encounter 06/02/2021   Strain of intrinsic muscle of right thumb 06/02/2021   Fall 06/02/2021    Past Surgical History:  Procedure Laterality Date   APPENDECTOMY     CLOSED REDUCTION NASAL FRACTURE Bilateral 03/11/2022   Procedure: CLOSED REDUCTION NASAL FRACTURE;  Surgeon: Vernie Murders, MD;  Location: [facility];  Service: ENT;  Laterality: Bilateral;   SEPTOPLASTY N/A 12/22/2017   Procedure: SEPTOPLASTY;  Surgeon: Vernie Murders, MD;  Location: [facility];  Service: ENT;  Laterality: N/A;   TURBINATE REDUCTION Bilateral 12/22/2017   Procedure: BILATERAL INFERIOR TURBINATE REDUCTION;  Surgeon: Vernie Murders, MD;  Location: [facility];  Service: ENT;  Laterality: Bilateral;       Home Medications    Prior to Admission medications   Medication Sig Start Date End Date Taking? Authorizing Provider  benzonatate (TESSALON) 100 MG capsule Take 2 capsules (200 mg total) by mouth every 8 (eight) hours. 01/21/23   Becky Augusta, NP  Cholecalciferol (VITAMIN D PO) Take by mouth daily.    [provider]  esomeprazole (NEXIUM) 20 MG  capsule Take 20 mg by mouth daily at 12 noon.    [provider]  ipratropium (ATROVENT) 0.06 % nasal spray Place 2 sprays into both nostrils 4 (four) times daily. 01/21/23   Becky Augusta, NP  promethazine-dextromethorphan (PROMETHAZINE-DM) 6.25-15 MG/5ML syrup Take 5 mLs by mouth 4 (four) times daily as needed. 01/21/23   Becky Augusta, NP    Family History History reviewed. No pertinent family history.  Social History Social History   Tobacco Use   Smoking status: Former    Current packs/day: 0.00    Average packs/day: 1 pack/day for 10.0 years (10.0 ttl pk-yrs)    Types: Cigarettes    Start date: 03/2007    Quit date: 03/2017    Years since quitting: 6.2   Smokeless tobacco: Never  Vaping Use   Vaping status: Some Days   Substances: Nicotine  Substance Use Topics   Alcohol use: Yes    Alcohol/week: 3.0 standard drinks of alcohol    Types: 3 Cans of beer per week   Drug use: Not Currently     Allergies   Patient has no known allergies.   Review of Systems Review of Systems  Constitutional:  Negative for fever.  Skin: Negative.   All other systems reviewed and are negative.    Physical Exam Triage Vital Signs ED Triage Vitals  Encounter Vitals Group     BP      Systolic BP Percentile  Diastolic BP Percentile      Pulse      Resp      Temp      Temp src      SpO2      Weight      Height      Head Circumference      Peak Flow      Pain Score      Pain Loc      Pain Education      Exclude from Growth Chart    No data found.  Updated Vital Signs BP 124/81 (BP Location: Right Arm)   Pulse 78   Temp 97.9 F (36.6 C) (Oral)   Resp 18   Ht 5\' 5"  (1.651 m)   Wt 155 lb (70.3 kg)   SpO2 96%   BMI 25.79 kg/m   Visual Acuity Right Eye Distance:   Left Eye Distance:   Bilateral Distance:    Right Eye Near:   Left Eye Near:    Bilateral Near:     Physical Exam Vitals and nursing note reviewed.  Chest:    Neurological:      General: No focal deficit present.     Mental Status: He is alert and oriented to person, place, and time.     GCS: GCS eye subscore is 4. GCS verbal subscore is 5. GCS motor subscore is 6.     Cranial Nerves: No cranial nerve deficit.     Sensory: No sensory deficit.  Psychiatric:        Attention and Perception: Attention normal.        Mood and Affect: Mood normal.        Speech: Speech normal.        Behavior: Behavior normal.      UC Treatments / Results  Labs (all labs ordered are listed, but only abnormal results are displayed) Labs Reviewed - No data to display  EKG   Radiology DG Ribs Unilateral W/Chest Left Result Date: 06/23/2023 CLINICAL DATA:  Left rib injury EXAM: LEFT RIBS AND CHEST - 3+ VIEW COMPARISON:  None Available. FINDINGS: No fracture or other bone lesions are seen involving the ribs. There is no evidence of pneumothorax or pleural effusion. Both lungs are clear. Heart size and mediastinal contours are within normal limits. IMPRESSION: Negative. Electronically Signed   By: Charlett Nose M.D.   On: 06/23/2023 19:17    Procedures Procedures (including critical care time)  Medications Ordered in UC Medications - No data to display  Initial Impression / Assessment and Plan / UC Course  I have reviewed the triage vital signs and the nursing notes.  Pertinent labs & imaging results that were available during my care of the patient were reviewed by me and considered in my medical decision making (see chart for details).    Discussed exam findings and plan of care with patient, strict go to ER precautions given.   Patient verbalized understanding to this provider.  Ddx: Chest wall pain, rib injury Final Clinical Impressions(s) / UC Diagnoses   Final diagnoses:  Rib injury  Chest wall pain     Discharge Instructions      CXR is normal, negative for fracture or pneumothorax.  May use over the counter tylenol,ibuprofen,lidocaine gel/patch/biofreeze as  label directed. Follow up with PCP, If you have new or worsening symptoms(chest pain, shortness of breath, palpitations, etc.) go to ER for further evaluation     ED Prescriptions   None  PDMP not reviewed this encounter.   Clancy Gourd, NP 06/23/23 1941
